# Patient Record
Sex: Female | Born: 1940 | Race: White | Hispanic: No | Smoking: Former smoker
Health system: Southern US, Community
[De-identification: ages and names within clinical notes are randomized; demographics above are authoritative.]

## PROBLEM LIST (undated history)

## (undated) DIAGNOSIS — E039 Hypothyroidism, unspecified: Secondary | ICD-10-CM

## (undated) DIAGNOSIS — I639 Cerebral infarction, unspecified: Secondary | ICD-10-CM

## (undated) DIAGNOSIS — F418 Other specified anxiety disorders: Secondary | ICD-10-CM

## (undated) DIAGNOSIS — I10 Essential (primary) hypertension: Secondary | ICD-10-CM

## (undated) DIAGNOSIS — I219 Acute myocardial infarction, unspecified: Secondary | ICD-10-CM

## (undated) HISTORY — DX: Other specified anxiety disorders: F41.8

## (undated) HISTORY — DX: Hypothyroidism, unspecified: E03.9

## (undated) HISTORY — PX: CORONARY ANGIOPLASTY WITH STENT PLACEMENT: SHX49

## (undated) HISTORY — PX: JOINT REPLACEMENT: SHX530

---

## 2012-06-01 DIAGNOSIS — I2510 Atherosclerotic heart disease of native coronary artery without angina pectoris: Secondary | ICD-10-CM

## 2012-06-01 DIAGNOSIS — I219 Acute myocardial infarction, unspecified: Secondary | ICD-10-CM

## 2012-06-01 HISTORY — PX: CORONARY ANGIOPLASTY WITH STENT PLACEMENT: SHX49

## 2012-06-01 HISTORY — DX: Atherosclerotic heart disease of native coronary artery without angina pectoris: I25.10

## 2012-06-01 HISTORY — DX: Acute myocardial infarction, unspecified: I21.9

## 2012-06-21 DIAGNOSIS — I2510 Atherosclerotic heart disease of native coronary artery without angina pectoris: Secondary | ICD-10-CM | POA: Insufficient documentation

## 2012-06-21 DIAGNOSIS — I252 Old myocardial infarction: Secondary | ICD-10-CM | POA: Insufficient documentation

## 2014-06-01 HISTORY — PX: GASTROSTOMY TUBE PLACEMENT: SHX655

## 2018-03-18 ENCOUNTER — Other Ambulatory Visit: Payer: Self-pay

## 2018-03-18 ENCOUNTER — Encounter (HOSPITAL_COMMUNITY): Payer: Self-pay | Admitting: Emergency Medicine

## 2018-03-18 ENCOUNTER — Emergency Department (HOSPITAL_COMMUNITY): Payer: Medicare Other

## 2018-03-18 ENCOUNTER — Emergency Department (HOSPITAL_COMMUNITY)
Admission: EM | Admit: 2018-03-18 | Discharge: 2018-03-19 | Disposition: A | Discharge status: Home / Self Care | Payer: Medicare Other | Attending: Emergency Medicine | Admitting: Emergency Medicine

## 2018-03-18 DIAGNOSIS — N83202 Unspecified ovarian cyst, left side: Secondary | ICD-10-CM | POA: Insufficient documentation

## 2018-03-18 DIAGNOSIS — R1032 Left lower quadrant pain: Secondary | ICD-10-CM | POA: Diagnosis present

## 2018-03-18 DIAGNOSIS — I10 Essential (primary) hypertension: Secondary | ICD-10-CM | POA: Insufficient documentation

## 2018-03-18 DIAGNOSIS — K6389 Other specified diseases of intestine: Secondary | ICD-10-CM

## 2018-03-18 DIAGNOSIS — Z955 Presence of coronary angioplasty implant and graft: Secondary | ICD-10-CM | POA: Insufficient documentation

## 2018-03-18 DIAGNOSIS — Z8673 Personal history of transient ischemic attack (TIA), and cerebral infarction without residual deficits: Secondary | ICD-10-CM | POA: Insufficient documentation

## 2018-03-18 DIAGNOSIS — K529 Noninfective gastroenteritis and colitis, unspecified: Secondary | ICD-10-CM | POA: Insufficient documentation

## 2018-03-18 DIAGNOSIS — Z87891 Personal history of nicotine dependence: Secondary | ICD-10-CM | POA: Diagnosis not present

## 2018-03-18 HISTORY — DX: Essential (primary) hypertension: I10

## 2018-03-18 HISTORY — DX: Acute myocardial infarction, unspecified: I21.9

## 2018-03-18 HISTORY — DX: Cerebral infarction, unspecified: I63.9

## 2018-03-18 LAB — CBC WITH DIFFERENTIAL/PLATELET
Abs Immature Granulocytes: 0.02 10*3/uL (ref 0.00–0.07)
Basophils Absolute: 0 10*3/uL (ref 0.0–0.1)
Basophils Relative: 1 %
Eosinophils Absolute: 0.1 10*3/uL (ref 0.0–0.5)
Eosinophils Relative: 2 %
HCT: 38.5 % (ref 36.0–46.0)
Hemoglobin: 11.5 g/dL — ABNORMAL LOW (ref 12.0–15.0)
Immature Granulocytes: 0 %
Lymphocytes Relative: 22 %
Lymphs Abs: 1.4 10*3/uL (ref 0.7–4.0)
MCH: 25.1 pg — ABNORMAL LOW (ref 26.0–34.0)
MCHC: 29.9 g/dL — ABNORMAL LOW (ref 30.0–36.0)
MCV: 83.9 fL (ref 80.0–100.0)
Monocytes Absolute: 0.6 10*3/uL (ref 0.1–1.0)
Monocytes Relative: 8 %
Neutro Abs: 4.4 10*3/uL (ref 1.7–7.7)
Neutrophils Relative %: 67 %
Platelets: 255 10*3/uL (ref 150–400)
RBC: 4.59 MIL/uL (ref 3.87–5.11)
RDW: 15 % (ref 11.5–15.5)
WBC: 6.5 10*3/uL (ref 4.0–10.5)
nRBC: 0 % (ref 0.0–0.2)

## 2018-03-18 LAB — COMPREHENSIVE METABOLIC PANEL
ALT: 10 U/L (ref 0–44)
AST: 14 U/L — ABNORMAL LOW (ref 15–41)
Albumin: 3.5 g/dL (ref 3.5–5.0)
Alkaline Phosphatase: 76 U/L (ref 38–126)
Anion gap: 7 (ref 5–15)
BUN: 8 mg/dL (ref 8–23)
CO2: 25 mmol/L (ref 22–32)
Calcium: 8.7 mg/dL — ABNORMAL LOW (ref 8.9–10.3)
Chloride: 106 mmol/L (ref 98–111)
Creatinine, Ser: 1.05 mg/dL — ABNORMAL HIGH (ref 0.44–1.00)
GFR calc Af Amer: 58 mL/min — ABNORMAL LOW (ref >60)
GFR calc non Af Amer: 50 mL/min — ABNORMAL LOW (ref >60)
Glucose, Bld: 93 mg/dL (ref 70–99)
Potassium: 3.4 mmol/L — ABNORMAL LOW (ref 3.5–5.1)
Sodium: 138 mmol/L (ref 135–145)
Total Bilirubin: 0.5 mg/dL (ref 0.3–1.2)
Total Protein: 6 g/dL — ABNORMAL LOW (ref 6.5–8.1)

## 2018-03-18 LAB — LIPASE, BLOOD: Lipase: 30 U/L (ref 11–51)

## 2018-03-18 MED ORDER — IOHEXOL 300 MG/ML  SOLN
100.0000 mL | Freq: Once | INTRAMUSCULAR | Status: AC | PRN
  Administered 2018-03-18: 100 mL via INTRAVENOUS

## 2018-03-18 NOTE — ED Triage Notes (Signed)
Pt reports LLQ abd pain that has been going on for the last month. Denies N/V/D. Last BM today, chronic constipation. Pt gets around with wheelchair. 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

## 2018-03-18 NOTE — ED Notes (Signed)
Iv removed, per patient's request.  Bubbling/bruising noted to area.  Bleeding controlled upon removal

## 2018-03-18 NOTE — ED Provider Notes (Signed)
Patient placed in Quick Look pathway, seen and evaluated   Chief Complaint: Abdominal pain  HPI:   LLQ abd pain x1 month, worse "lately". Denies n/v/d, fevers, urinary symptoms, last BM today, chronic constipation. "Tested positive for colon cancer" 4 years ago but had just experienced stroke at the time and never followed up.   ROS: +abd pain  Physical Exam:   Gen: No distress  Neuro: Awake and Alert  Skin: Warm    Focused Exam: Heart regular rate and rhythm, no murmurs rubs or gallops.  Lungs clear to auscultation.  Hyperactive bowel sounds.  Exquisite tenderness to palpation of the left lower quadrant.  No CVA tenderness.  Guarding noted.  No rebound.   Initiation of care has begun. The patient has been counseled on the process, plan, and necessity for staying for the completion/evaluation, and the remainder of the medical screening examination    Bennye Alm 03/18/18 1636    Dione Booze, MD 03/19/18 0030

## 2018-03-19 MED ORDER — HYDROCODONE-ACETAMINOPHEN 5-325 MG PO TABS: 1.0000 | ORAL_TABLET | Freq: Four times a day (QID) | ORAL | 0 refills | Status: DC | PRN

## 2018-03-19 NOTE — ED Notes (Signed)
Patient verbalizes understanding of discharge instructions. Opportunity for questioning and answers were provided. Armband removed by staff, pt discharged from ED in wheelchair with family.  

## 2018-03-19 NOTE — Discharge Instructions (Signed)
Continue taking home medication as prescribed.  Use tylenol as needed for mild to moderate pain. Use noroc as needed for severe pain. If you take norco, make sure you drink lots of water, take mirlax, and a stool softener to prevent constipation.  Follow up with your primary care doctor regarding the ovarian cyst and the findings of possible colon cancer.  Return to the ER if you develop high fevers, severe/persistent pain, persistent vomiting, or any new or concerning symptoms.

## 2018-03-19 NOTE — ED Provider Notes (Signed)
[facility] EMERGENCY DEPARTMENT Provider Note   CSN: [id number] Arrival date & time: 03/18/18  1608     History   Chief Complaint Chief Complaint  Patient presents with  . Abdominal Pain    HPI Kimberly Mora is a 77 y.o. female presenting for evaluation of abdominal pain.  Patient states that the past month, she has been feeling poorly.  In the past several days, she has had left mid to lower quadrant abdominal pain.  Pain is intermittent, worse with palpation.  She denies fevers, chills, chest pain, shortness breath, nausea, vomiting, urinary symptoms, abnormal bowel movements.  She has not taken anything for pain.  Pain does not change with oral intake, urination, or bowel movements.  Patient states she has a history of constipation, takes MiraLAX daily.   Additional history obtained from patient's daughter, who states that when patient was recently admitted for stroke, there were concerns that she might have colon cancer, but this has not been followed up on or evaluated since.  HPI  Past Medical History:  Diagnosis Date  . Hypertension   . MI (myocardial infarction) (HCC)   . Stroke [facility])    rt sided deficits, aphasia    There are no active problems to display for this patient.   Past Surgical History:  Procedure Laterality Date  . CORONARY ANGIOPLASTY WITH STENT PLACEMENT       OB History   None      Home Medications    Prior to Admission medications   Medication Sig Start Date End Date Taking? Authorizing Provider  HYDROcodone-acetaminophen (NORCO/VICODIN) 5-325 MG tablet Take 1 tablet by mouth every 6 (six) hours as needed for severe pain. 03/19/18   Rexton Greulich, PA-C    Family History No family history on file.  Social History Social History   Tobacco Use  . Smoking status: Former Smoker    Years: 30.00    Types: Cigarettes  . Smokeless tobacco: Never Used  Substance Use Topics  . Alcohol use: Never    Frequency:  Never  . Drug use: Never     Allergies   Patient has no known allergies.   Review of Systems Review of Systems  Gastrointestinal: Positive for abdominal pain.  All other systems reviewed and are negative.    Physical Exam Updated Vital Signs BP (!) 151/66 (BP Location: Left Arm)   Pulse (!) 56   Temp (!) 97.5 F (36.4 C) (Axillary)   Resp 18   Ht 5\' 2"  (1.575 m)   SpO2 99%   Physical Exam  Constitutional: She is oriented to person, place, and time. She appears well-developed and well-nourished. No distress.  Elderly female resting comfortably in the bed in no acute distress.  HENT:  Head: Normocephalic and atraumatic.  Right-sided facial droop, baseline per daughter.  Eyes: Pupils are equal, round, and reactive to light. Conjunctivae and EOM are normal.  Neck: Normal range of motion. Neck supple.  Cardiovascular: Normal rate, regular rhythm and intact distal pulses.  Pulmonary/Chest: Effort normal and breath sounds normal. No respiratory distress. She has no wheezes.  Abdominal: Soft. She exhibits no distension and no mass. There is tenderness. There is no rebound and no guarding.  Tenderness palpation of left mid and lower abdomen.  Negative rebound.  No signs of peritonitis.  No rigidity, guarding, distention.  Musculoskeletal: Normal range of motion.  Neurological: She is alert and oriented to person, place, and time.  Slurred speech, baseline per pt.  Skin: Skin is warm and dry. Capillary refill takes less than 2 seconds.  Psychiatric: She has a normal mood and affect.  Nursing note and vitals reviewed.    ED Treatments / Results  Labs (all labs ordered are listed, but only abnormal results are displayed) Labs Reviewed  CBC WITH DIFFERENTIAL/PLATELET - Abnormal; Notable for the following components:      Result Value   Hemoglobin 11.5 (*)    MCH 25.1 (*)    MCHC 29.9 (*)    All other components within normal limits  COMPREHENSIVE METABOLIC PANEL -  Abnormal; Notable for the following components:   Potassium 3.4 (*)    Creatinine, Ser 1.05 (*)    Calcium 8.7 (*)    Total Protein 6.0 (*)    AST 14 (*)    GFR calc non Af Amer 50 (*)    GFR calc Af Amer 58 (*)    All other components within normal limits  LIPASE, BLOOD  URINALYSIS, ROUTINE W REFLEX MICROSCOPIC    EKG None  Radiology Ct Abdomen Pelvis W Contrast  Result Date: 03/18/2018 CLINICAL DATA:  Left lower quadrant pain for 1 month. EXAM: CT ABDOMEN AND PELVIS WITH CONTRAST TECHNIQUE: Multidetector CT imaging of the abdomen and pelvis was performed using the standard protocol following bolus administration of intravenous contrast. CONTRAST:  100 mL OMNIPAQUE IOHEXOL 300 MG/ML  SOLN COMPARISON:  None. FINDINGS: Lower chest: There is some dependent atelectasis in the lung bases. No pleural or pericardial effusion. Cardiomegaly noted. Hepatobiliary: A few tiny stones are seen layering dependently within the gallbladder. No evidence of cholecystitis. The liver is low attenuating consistent with fatty infiltration. No focal liver lesion. Biliary tree appears normal. Pancreas: Unremarkable. No pancreatic ductal dilatation or surrounding inflammatory changes. Spleen: Normal in size without focal abnormality. Adrenals/Urinary Tract: The adrenal glands appear normal. Single small cysts are seen in the lower pole of each kidney. The kidneys are otherwise unremarkable. Ureters and urinary bladder appear normal. Stomach/Bowel: A few scattered sigmoid diverticula are noted but there is no evidence of diverticulitis. Inflammatory changes seen surrounding of fat structure along the descending colon consistent with inflammation of an epiploic appendage. No abscess. The colon is otherwise unremarkable. The stomach, small bowel and appendix appear normal. Vascular/Lymphatic: Aortic atherosclerosis. No enlarged abdominal or pelvic lymph nodes. Reproductive: Left ovarian cystic lesion measures 2.6 cm  transverse by 4 cm AP by 3.3 cm craniocaudal. The uterus and right adnexa appear normal. Other: None. Musculoskeletal: No acute or focal abnormality. 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 IMPRESSION: The examination is positive for appendagitis epiploica distal descending colon. Left ovarian cyst cannot be definitively characterized. Pelvic ultrasound is recommended for further evaluation. This recommendation follows ACR consensus guidelines: White Paper of the ACR Incidental Findings Committee II on Adnexal Findings. J Am Coll Radiol [phone]. Gallstones without evidence of cholecystitis. Mild diverticulosis without diverticulitis. Electronically Signed   By: Drusilla Kanner M.D.   On: 03/18/2018 19:45    Procedures Procedures (including critical care time)  Medications Ordered in ED Medications  iohexol (OMNIPAQUE) 300 MG/ML solution 100 mL (100 mLs Intravenous Contrast Given 03/18/18 1913)     Initial Impression / Assessment and Plan / ED Course  I have reviewed the triage vital signs and the nursing notes.  Pertinent labs & imaging results that were available during my care of the patient were reviewed by me and considered in my medical decision making (  see chart for details).     Patient presenting for evaluation of abdominal pain.  Physical examination, she is afebrile not tachycardic.  Appears nontoxic.  Labs reassuring, no leukocytosis.  Kidney, liver, pancreatic function reassuring.  Exam with mid to lower left lower quadrant abdominal pain.  Otherwise reassuring.  CT shows epiploic appendagitis and left ovarian cyst.  No obstruction, perforation, or obvious infection.  No signs of a surgical abdomen.  Discussed findings with patient and daughter.  Discussed treatment with pain control and time.  Additionally, patient to follow-up with PCP regarding ovarian cyst and possible need for work-up for colon  cancer.  Case discussed with attending, Dr. Preston Fleeting evaluated the patient.  Patient to trial Tylenol for mild to moderate pain.  Narcotics only for severe pain, as patient already has issues with constipation.  Encouraged follow-up with PCP.  At this time, patient appears safe for discharge.  Return precautions given.  Patient states she understands and agrees to plan.  Final Clinical Impressions(s) / ED Diagnoses   Final diagnoses:  Epiploic appendagitis  Left ovarian cyst    ED Discharge Orders         Ordered    HYDROcodone-acetaminophen (NORCO/VICODIN) 5-325 MG tablet  Every 6 hours PRN     03/19/18 0025           Alveria Apley, PA-C 03/19/18 0132    Dione Booze, MD 03/19/18 [phone]

## 2020-05-01 DIAGNOSIS — K648 Other hemorrhoids: Secondary | ICD-10-CM

## 2020-05-01 DIAGNOSIS — K8020 Calculus of gallbladder without cholecystitis without obstruction: Secondary | ICD-10-CM

## 2020-05-01 HISTORY — DX: Calculus of gallbladder without cholecystitis without obstruction: K80.20

## 2020-05-01 HISTORY — DX: Other hemorrhoids: K64.8

## 2020-05-17 ENCOUNTER — Encounter (HOSPITAL_COMMUNITY): Payer: Self-pay | Admitting: Pharmacy Technician

## 2020-05-17 ENCOUNTER — Other Ambulatory Visit: Payer: Self-pay

## 2020-05-17 ENCOUNTER — Emergency Department (HOSPITAL_COMMUNITY)
Admission: EM | Admit: 2020-05-17 | Discharge: 2020-05-18 | Disposition: A | Discharge status: Home / Self Care | Payer: Medicare Other | Attending: Emergency Medicine | Admitting: Emergency Medicine

## 2020-05-17 DIAGNOSIS — I10 Essential (primary) hypertension: Secondary | ICD-10-CM | POA: Diagnosis not present

## 2020-05-17 DIAGNOSIS — K648 Other hemorrhoids: Secondary | ICD-10-CM

## 2020-05-17 DIAGNOSIS — Z87891 Personal history of nicotine dependence: Secondary | ICD-10-CM | POA: Diagnosis not present

## 2020-05-17 DIAGNOSIS — K8020 Calculus of gallbladder without cholecystitis without obstruction: Secondary | ICD-10-CM

## 2020-05-17 DIAGNOSIS — K625 Hemorrhage of anus and rectum: Secondary | ICD-10-CM | POA: Diagnosis not present

## 2020-05-17 DIAGNOSIS — K649 Unspecified hemorrhoids: Secondary | ICD-10-CM

## 2020-05-17 DIAGNOSIS — R109 Unspecified abdominal pain: Secondary | ICD-10-CM | POA: Diagnosis present

## 2020-05-17 LAB — URINALYSIS, ROUTINE W REFLEX MICROSCOPIC
Bilirubin Urine: NEGATIVE
Glucose, UA: NEGATIVE mg/dL
Ketones, ur: NEGATIVE mg/dL
Nitrite: NEGATIVE
Protein, ur: NEGATIVE mg/dL
Specific Gravity, Urine: 1.003 — ABNORMAL LOW (ref 1.005–1.030)
pH: 7 (ref 5.0–8.0)

## 2020-05-17 LAB — CBC
HCT: 38.6 % (ref 36.0–46.0)
Hemoglobin: 12.3 g/dL (ref 12.0–15.0)
MCH: 26.9 pg (ref 26.0–34.0)
MCHC: 31.9 g/dL (ref 30.0–36.0)
MCV: 84.3 fL (ref 80.0–100.0)
Platelets: 255 10*3/uL (ref 150–400)
RBC: 4.58 MIL/uL (ref 3.87–5.11)
RDW: 15.8 % — ABNORMAL HIGH (ref 11.5–15.5)
WBC: 5.3 10*3/uL (ref 4.0–10.5)
nRBC: 0 % (ref 0.0–0.2)

## 2020-05-17 LAB — COMPREHENSIVE METABOLIC PANEL
ALT: 12 U/L (ref 0–44)
AST: 17 U/L (ref 15–41)
Albumin: 3.3 g/dL — ABNORMAL LOW (ref 3.5–5.0)
Alkaline Phosphatase: 65 U/L (ref 38–126)
Anion gap: 10 (ref 5–15)
BUN: 5 mg/dL — ABNORMAL LOW (ref 8–23)
CO2: 23 mmol/L (ref 22–32)
Calcium: 8.6 mg/dL — ABNORMAL LOW (ref 8.9–10.3)
Chloride: 108 mmol/L (ref 98–111)
Creatinine, Ser: 0.87 mg/dL (ref 0.44–1.00)
GFR, Estimated: >60 mL/min (ref >60)
Glucose, Bld: 87 mg/dL (ref 70–99)
Potassium: 3.5 mmol/L (ref 3.5–5.1)
Sodium: 141 mmol/L (ref 135–145)
Total Bilirubin: 0.7 mg/dL (ref 0.3–1.2)
Total Protein: 5.5 g/dL — ABNORMAL LOW (ref 6.5–8.1)

## 2020-05-17 LAB — TYPE AND SCREEN
ABO/RH(D): A POS
Antibody Screen: NEGATIVE

## 2020-05-17 LAB — LIPASE, BLOOD: Lipase: 29 U/L (ref 11–51)

## 2020-05-17 NOTE — ED Triage Notes (Signed)
Pt bib ems from home with RUQ abdominal pain onset this morning. Pt also endorses BRB in stool today. R sided deficit from previous CVA. No new neuro deficits per pt. Pt is on plavix. VSS. HR 64, 12 lead unremarkable, 148/82, 98% RA, RR 20, 98.66F, CBG 110.

## 2020-05-17 NOTE — ED Notes (Signed)
Pt reports "going to the bathroom and having bloody drip", also report feeling "fusy and faint"

## 2020-05-18 ENCOUNTER — Emergency Department (HOSPITAL_COMMUNITY): Payer: Medicare Other

## 2020-05-18 DIAGNOSIS — K625 Hemorrhage of anus and rectum: Secondary | ICD-10-CM | POA: Diagnosis not present

## 2020-05-18 LAB — POC OCCULT BLOOD, ED: Fecal Occult Bld: NEGATIVE

## 2020-05-18 LAB — ABO/RH: ABO/RH(D): A POS

## 2020-05-18 LAB — PROTIME-INR
INR: 0.9 (ref 0.8–1.2)
Prothrombin Time: 12 seconds (ref 11.4–15.2)

## 2020-05-18 LAB — HEMOGLOBIN AND HEMATOCRIT, BLOOD
HCT: 40.6 % (ref 36.0–46.0)
Hemoglobin: 12.5 g/dL (ref 12.0–15.0)

## 2020-05-18 MED ORDER — HYDROCODONE-ACETAMINOPHEN 5-325 MG PO TABS: 1.0000 | ORAL_TABLET | Freq: Four times a day (QID) | ORAL | 0 refills | Status: DC | PRN

## 2020-05-18 MED ORDER — ONDANSETRON 4 MG PO TBDP
4.0000 mg | ORAL_TABLET | Freq: Once | ORAL | Status: AC
  Administered 2020-05-18: 4 mg via ORAL
  Filled 2020-05-18: qty 1

## 2020-05-18 MED ORDER — ONDANSETRON 4 MG PO TBDP: 4.0000 mg | ORAL_TABLET | Freq: Four times a day (QID) | ORAL | 0 refills | Status: DC | PRN

## 2020-05-18 MED ORDER — IOHEXOL 300 MG/ML  SOLN
100.0000 mL | Freq: Once | INTRAMUSCULAR | Status: AC | PRN
  Administered 2020-05-18: 100 mL via INTRAVENOUS

## 2020-05-18 MED ORDER — DOCUSATE SODIUM 100 MG PO CAPS: 100.0000 mg | ORAL_CAPSULE | Freq: Two times a day (BID) | ORAL | 0 refills | Status: AC

## 2020-05-18 MED ORDER — HYDROCORTISONE (PERIANAL) 2.5 % EX CREA: 1.0000 "application " | TOPICAL_CREAM | Freq: Two times a day (BID) | CUTANEOUS | 0 refills | Status: DC

## 2020-05-18 MED ORDER — HYDROCODONE-ACETAMINOPHEN 5-325 MG PO TABS
1.0000 | ORAL_TABLET | Freq: Once | ORAL | Status: AC
  Administered 2020-05-18: 1 via ORAL
  Filled 2020-05-18: qty 1

## 2020-05-18 NOTE — ED Notes (Signed)
E-signature pad unavailable at time of pt discharge. This RN discussed discharge materials with pt and answered all pt questions. Pt stated understanding of discharge material. ? ?

## 2020-05-18 NOTE — ED Provider Notes (Addendum)
TIME SEEN: 12:58 AM  CHIEF COMPLAINT: Abdominal pain, rectal bleeding  HPI: Patient is an 79 year old female with history of hypertension, previous stroke with right-sided deficits and aphasia, CAD who presents to the emergency department with her daughter for concerns for rectal bleeding and abdominal pain for the past 1 to 2 days.  They noticed that she had a mass at her rectum that appeared abnormal.  She is having bright red blood per rectum.  No melena.  No clots.  No fevers, vomiting or diarrhea.  No chest pain or shortness of breath.  She is on Plavix.  No previous history of abdominal surgeries.  Denies dysuria, hematuria, vaginal bleeding or discharge.  ROS: See HPI Constitutional: no fever  Eyes: no drainage  ENT: no runny nose   Cardiovascular:  no chest pain  Resp: no SOB  GI: no vomiting GU: no dysuria Integumentary: no rash  Allergy: no hives  Musculoskeletal: no leg swelling  Neurological: no slurred speech ROS otherwise negative  PAST MEDICAL HISTORY/PAST SURGICAL HISTORY:  Past Medical History:  Diagnosis Date   Hypertension    MI (myocardial infarction) (HCC)    Stroke (HCC)    rt sided deficits, aphasia    MEDICATIONS:  Prior to Admission medications   Medication Sig Start Date End Date Taking? Authorizing Provider  HYDROcodone-acetaminophen (NORCO/VICODIN) 5-325 MG tablet Take 1 tablet by mouth every 6 (six) hours as needed for severe pain. 03/19/18   Caccavale, Sophia, PA-C    ALLERGIES:  No Known Allergies  SOCIAL HISTORY:  Social History   Tobacco Use   Smoking status: Former Smoker    Years: 30.00    Types: Cigarettes   Smokeless tobacco: Never Used  Substance Use Topics   Alcohol use: Never    FAMILY HISTORY: No family history on file.  EXAM: BP (!) 167/73 (BP Location: Left Arm)    Pulse 78    Temp 97.8 F (36.6 C) (Oral)    Resp 18    Ht 5\' 1"  (1.549 m)    SpO2 99%  CONSTITUTIONAL: Alert and oriented and responds appropriately  to questions.  Elderly, in no distress HEAD: Normocephalic EYES: Conjunctivae clear, pupils appear equal, EOM appear intact ENT: normal nose; moist mucous membranes NECK: Supple, normal ROM CARD: RRR; S1 and S2 appreciated; no murmurs, no clicks, no rubs, no gallops RESP: Normal chest excursion without splinting or tachypnea; breath sounds clear and equal bilaterally; no wheezes, no rhonchi, no rales, no hypoxia or respiratory distress, speaking full sentences ABD/GI: Normal bowel sounds; non-distended; soft, tender in the right upper and mid abdomen but more so in the right lower abdomen with voluntary guarding, no rebound, negative Murphy sign RECTAL:  Normal rectal tone, no gross blood or melena, guaiac negative, patient has nonthrombosed external hemorrhoids on exam, patient has two prolapsed internal hemorrhoids that are not actively bleeding, no rectal prolapse, nontender rectal exam, no fecal impaction. BACK:  The back appears normal EXT: Normal ROM in all joints; no deformity noted, no edema; no cyanosis SKIN: Normal color for age and race; warm; no rash on exposed skin NEURO: Weaker on the right side compared to the left which is chronic, mild aphasia PSYCH: The patient's mood and manner are appropriate.   MEDICAL DECISION MAKING: Patient here with abdominal pain and rectal bleeding.  I suspect her rectal bleeding is due to hemorrhoids.  She has two prolapsed internal hemorrhoids on exam.  No rectal prolapse.  Unable to reduce at bedside.  Hemoglobin obtained  initially is 12.3.  Given she has waited an extended period of time, will recheck hemoglobin to ensure no decline.  Will obtain CT of the abdomen pelvis given her right lower abdominal pain.  Differential includes appendicitis, colitis, diverticulitis, bowel obstruction, cholecystitis, cholelithiasis.  ED PROGRESS: Repeat hemoglobin is stable.  No coagulopathy or thrombocytopenia.  Urine appears contaminated.  She is not having urinary  symptoms at this time.  CT scan shows cholelithiasis.  She also has what appears to be a left adnexal cysts that is incidental in nature.  She is not having any left-sided abdominal pain.  Recommended outpatient ultrasound.  Suspect cholelithiasis as a cause of her symptoms.  Recommended general surgery follow-up for her cholelithiasis as well as her hemorrhoids.  She is not actively bleeding at this time and is guaiac negative.  Will discharge with pain and nausea medicine.  Patient and daughter are comfortable with this plan.  Recommended low-fat diet.  At this time, I do not feel there is any life-threatening condition present. I have reviewed, interpreted and discussed all results (EKG, imaging, lab, urine as appropriate) and exam findings with patient/family. I have reviewed nursing notes and appropriate previous records.  I feel the patient is safe to be discharged home without further emergent workup and can continue workup as an outpatient as needed. Discussed usual and customary return precautions. Patient/family verbalize understanding and are comfortable with this plan.  Outpatient follow-up has been provided as needed. All questions have been answered.    Kimberly Mora was evaluated in Emergency Department on 05/18/2020 for the symptoms described in the history of present illness. She was evaluated in the context of the global COVID-19 pandemic, which necessitated consideration that the patient might be at risk for infection with the SARS-CoV-2 virus that causes COVID-19. Institutional protocols and algorithms that pertain to the evaluation of patients at risk for COVID-19 are in a state of rapid change based on information released by regulatory bodies including the CDC and federal and state organizations. These policies and algorithms were followed during the patient's care in the ED.      Kimberly Mora, Layla Maw, DO 05/18/20 0444    Cherine Drumgoole, Layla Maw, DO 05/18/20 [phone]

## 2020-05-18 NOTE — ED Notes (Signed)
Orthostatic vital signs have not been completed because patient is unable to stand because of prior stroke. Pt has right sided paralysis.

## 2020-05-18 NOTE — Discharge Instructions (Signed)
You are found to have gallstones in your gallbladder that are likely causing your right-sided abdominal pain.  I recommend a low-fat diet and follow-up with general surgery.  You were also found to have external and internal prolapsed hemorrhoids.  These are likely the cause of your rectal bleeding.  Your blood counts today were stable and normal.  Please follow-up with general surgery.  We we are prescribing you with Anusol to help with pain, swelling and bleeding.  I recommend keeping your stool soft with over-the-counter stool softener such as Colace and a high-fiber diet.  If you cannot increase your fiber intake in your diet, you may use Metamucil or Benefiber over-the-counter.  Please note that the narcotic pain medication that we are prescribing you for your abdominal pain can make you more constipated and constipation can worsen pain and bleeding from hemorrhoids so it is very important to keep your stool soft.  I recommend only taking this pain medication if absolutely necessary.  You may also take over-the-counter Tylenol 1000 mg every 6 hours as needed for pain.  Your CT scan also incidentally showed a cyst on your left ovary.  The radiologist recommends an ultrasound that can be done as an outpatient.  This does not need to be done emergently unless you did start to develop left-sided lower abdominal pain, vaginal bleeding.

## 2020-06-06 ENCOUNTER — Other Ambulatory Visit: Payer: Self-pay | Admitting: Surgery

## 2020-06-06 DIAGNOSIS — R1011 Right upper quadrant pain: Secondary | ICD-10-CM

## 2020-06-13 ENCOUNTER — Ambulatory Visit
Admission: RE | Admit: 2020-06-13 | Discharge: 2020-06-13 | Disposition: A | Discharge status: Home / Self Care | Payer: Medicare Other | Source: Ambulatory Visit | Attending: Surgery | Admitting: Surgery

## 2020-06-13 DIAGNOSIS — R1011 Right upper quadrant pain: Secondary | ICD-10-CM

## 2020-06-21 ENCOUNTER — Ambulatory Visit (INDEPENDENT_AMBULATORY_CARE_PROVIDER_SITE_OTHER): Payer: Medicare Other | Admitting: Cardiology

## 2020-06-21 ENCOUNTER — Encounter: Payer: Self-pay | Admitting: Cardiology

## 2020-06-21 ENCOUNTER — Other Ambulatory Visit: Payer: Self-pay

## 2020-06-21 VITALS — BP 128/60 | HR 69 | Ht 61.0 in | Wt 132.6 lb

## 2020-06-21 DIAGNOSIS — Z9861 Coronary angioplasty status: Secondary | ICD-10-CM | POA: Diagnosis not present

## 2020-06-21 DIAGNOSIS — I2510 Atherosclerotic heart disease of native coronary artery without angina pectoris: Secondary | ICD-10-CM | POA: Diagnosis not present

## 2020-06-21 DIAGNOSIS — K8010 Calculus of gallbladder with chronic cholecystitis without obstruction: Secondary | ICD-10-CM | POA: Insufficient documentation

## 2020-06-21 DIAGNOSIS — I252 Old myocardial infarction: Secondary | ICD-10-CM | POA: Diagnosis not present

## 2020-06-21 DIAGNOSIS — E785 Hyperlipidemia, unspecified: Secondary | ICD-10-CM

## 2020-06-21 DIAGNOSIS — K8020 Calculus of gallbladder without cholecystitis without obstruction: Secondary | ICD-10-CM

## 2020-06-21 DIAGNOSIS — Z8673 Personal history of transient ischemic attack (TIA), and cerebral infarction without residual deficits: Secondary | ICD-10-CM

## 2020-06-21 DIAGNOSIS — I10 Essential (primary) hypertension: Secondary | ICD-10-CM

## 2020-06-21 DIAGNOSIS — K8309 Other cholangitis: Secondary | ICD-10-CM

## 2020-06-21 DIAGNOSIS — Z01810 Encounter for preprocedural cardiovascular examination: Secondary | ICD-10-CM | POA: Diagnosis not present

## 2020-06-21 NOTE — Progress Notes (Signed)
Primary Care Provider: Patient, No Pcp Per Cardiologist: Glenetta Hew, MD Electrophysiologist: None  Clinic Note: Chief Complaint  Patient presents with  . Pre-op Exam  . 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  At this point, she has not had any angina since her MI.  She is on clopidogrel for combination of MI and CVA.  She is on verapamil-unusual dosing of 120 mg TID (presumably in place of beta-blocker), along with rosuvastatin 40 mg.  She is part about that holding Plavix is reasonable 5 days preop for surgeries.  With no recurrent angina, although she is not reactive, there would be no need to proceed with ischemic evaluation prior to an upcoming surgery.  This was answered to delay a necessary operation..      Relevant Medications   rosuvastatin (CRESTOR) 40 MG tablet   verapamil (CALAN) 120 MG tablet   Other Relevant Orders   EKG 12-Lead   ECHOCARDIOGRAM COMPLETE   History of heart attack-unknown details (Chronic)    I would like to get the actual cath report and hospitalization report.  I cannot tell that she has had any type of evaluation of her heart since 2016 when she left [place].  Apparently the daughter normally does have some records we will try to get those records.  Based on the fact that she has had an MRI and a stroke, but it is reasonable to check an echocardiogram to get a baseline assessment of her EF and valvular structures.  She does have a soft murmur..  We will check a 2D echocardiogram prior to her surgery.  Provided this is normal, there should be no reason for any further cardiac evaluation preoperatively.      Relevant Orders   EKG 12-Lead   ECHOCARDIOGRAM COMPLETE   Essential hypertension (Chronic)    Blood pressure stable.  She is on  verapamil, and I will continue for now      Relevant Medications   rosuvastatin (CRESTOR) 40 MG tablet   verapamil (CALAN) 120 MG tablet   Hyperlipidemia with target LDL less than 70 (Chronic)    She just had lipids checked and they are all relatively well controlled.  LDL is 67.  Plan: Continue rosuvastatin.      Relevant Medications   rosuvastatin (CRESTOR) 40 MG tablet   verapamil (CALAN) 120 MG tablet   Preop cardiovascular exam - Primary (Chronic)    Unfortunately [name] is relatively immobile and therefore not able to determine if she is having any active anginal symptoms with exertion, however with whenever she is able to do, she is not having any symptoms and therefore would not clinically warranted ischemic evaluation.  I do think getting a 2D echocardiogram is reasonable to assess EF, wall motion, and valvular structures to get a baseline prior to her surgery.  She is far enough out from both her stroke and MI/PCI to hold Plavix 5 days preop for surgeries or procedures.  She can restart when safe postoperatively.  --> Recommendation will be to proceed to the OR provider Echocardiogram is relatively normal.  We will review the results of the Echocardiogram (hopefully before next week) for conclusive recommendations.  Otherwise would proceed to the OR without further evaluation. We will go ahead and have her hold Plavix as  of now.  She will  PREOPERATIVE CARDIAC RISK ASSESSMENT   Revised Cardiac Risk Index:  High Risk Surgery: no; provided the procedure is done laparoscopically-would be intermediate risk.  (If done open, would be high risk)  Defined as Intraperitoneal, intrathoracic or suprainguinal vascular  Active CAD: yes; but no active symptoms  CHF: no;   Cerebrovascular Disease: yes; presumably-has report of stroke (although this could have been TBI)  Diabetes: no; On Insulin: no  CKD (Cr >~ 2): no; most recent creatinine was 0.88  Total: 2 Estimated Risk of  Adverse Outcome: Class II-III Estimated Risk of MI, PE, VF/VT (Cardiac Arrest), Complete Heart Block: 6-10.1 % --> this is based on presence of CAD, albeit not active.   ACC/AHA Guidelines for "Clearance":  Step 1 - Need for Emergency Surgery: No: But urgent  If Yes - go straight to OR with perioperative surveillance  Step 2 - Active Cardiac Conditions (Unstable Angina, Decompensated HF, Significant  Arrhytmias - Complete HB, Mobitz II, Symptomatic VT or SVT, Severe Aortic Stenosis - mean gradient > 40 mmHg, Valve area < 1.0 cm2):   No: No active angina.  PCI was in 2014.  If Yes - Evaluate & Treat per ACC/AHA Guidelines  Step 3 -  Low Risk Surgery: No: Intermediate risk  If Yes --> proceed to OR  If No --> Step 4  Step 4 - Functional Capacity >= 4 METS without symptoms: No: Wheelchair-bound  If Yes --> proceed to OR  If No --> Step 5  Step 5 --  Clinical Risk Factors (CRF)   3 or more: No:   If Yes -- assess Surgical Risk, --   (High Risk Non-cardiac), Intraabdominal or thoracic vascular surgery consider testing if it will change management.  Intermediate Risk: Proceed to OR with HR control, or consider testing if it will change management  1-2 or more CRFs: Yes  If Yes -- assess Surgical Risk, --   (High Risk Non-cardiac), Intraabdominal or thoracic vascular surgery --> Proceed to OR, or consider testing if it will change management.  Intermediate Risk: Proceed to OR with HR control, or consider testing if it will change management       Relevant Orders   EKG 12-Lead   ECHOCARDIOGRAM COMPLETE   Cholelithiasis with acute cholangitis    She is having significant pain and needs to have her surgery.  I would not delay her surgery for ischemic evaluation unless her echocardiogram is grossly abnormal.      History of stroke     ===================================  HPI:    Kimberly Mora is a 80 y.o. female with a PMH Notable for prior CVA, HLD and listed  diagnosis of coronary artery disease (unclear details) who is being seen today today for STAT CARDIAC CLEARANCE for [facility] SURGERY at the request of Armandina Gemma, MD.  Kimberly Mora was last seen on June 05, 2020 by Dr. Harlow Asa as part of her ER follow-up from December 17 -> deferred by the EDP.  She presented with right upper quadrant pain and rectal bleeding.  Was found to have gallstones and active cholelithiasis without cholecystitis.  Upon evaluation with Dr. Darcus Austin she was noting persistent episodes of right upper quadrant pain that has been difficult to control as far as pain goes.  There is also complaint of rectal bleeding which is thought to be related to internal hemorrhoids.  Recent Hospitalizations:   December 17-18th, 2021: ER visit for abdominal pain x2 days.  Noted to have  ER/PR.  Was noticed to have a "mass at the rectum that was abnormal.  CT scan suggested cholelithiasis and left adnexal cyst.  Cholecystectomy and hemorrhoidectomy was recommended.  Reviewed  CV studies:    The following studies were reviewed today: (if available, images/films reviewed: From Epic Chart or Care Everywhere) . No reports available cardiac cath-PCI.  Also no report of echo.:  Interval History:   Kimberly Mora presents here today with her daughter-for preop evaluation for cholecystectomy and internal hemorrhoid surgery.  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  She is essentially wheelchair-bound, but can walk some-mostly limited by unsteady gait.  She can use her walker to get around the house a little bit, but mostly sits in a wheelchair.  She tries to do some exertional activities but is not very active.  She apparently suffered an MI in 1497 requiring helicopter flight to [place] from her hometown in [place] (close to [place]).  She does not have any recollection of this  episode nor does the daughter was here with her.  I do not have any records available.  Thankfully, she has not had any adverse cardiac events since then.  Apparently the last time she saw her cardiologist was in 2016 prior to her stroke episode that led to her moving to [place].  She does not have any complaints of any chest pain or pressure with rest or exertion.  She has not had any heart failure symptoms of PND, orthopnea or edema.  No syncope or near syncope.  No rapid or irregular heartbeats, palpitations.  The patient does not have symptoms concerning for COVID-19 infection (fever, chills, cough, or new shortness of breath).   REVIEWED OF SYSTEMS   Review of Systems  Constitutional: Negative for fever, malaise/fatigue and weight loss.  HENT: Negative for congestion and nosebleeds.   Respiratory: Negative for cough and shortness of breath.   Cardiovascular: Negative for leg swelling.  Gastrointestinal: Positive for abdominal pain (Right upper quadrant), blood in stool, constipation and nausea. Negative for melena.  Genitourinary: Negative for hematuria.  Neurological: Positive for focal weakness. Negative for dizziness, speech change (No change, has chronic slurred speech with expressive aphasia.), seizures, loss of consciousness and headaches.       Right upper extremity dense paralysis, able to move right leg, but unable to walk stably  Psychiatric/Behavioral: Negative for depression and memory loss. The patient is not nervous/anxious and does not have insomnia.     I have reviewed and (if needed) personally updated the patient's problem list, medications, allergies, past medical and surgical history, social and family history.   PAST MEDICAL HISTORY   Past Medical History:  Diagnosis Date  . Anxiety with depression    On Lexapro  . Bleeding internal hemorrhoids 05/2020  . CAD S/P percutaneous coronary angioplasty 2014   In setting of an MI  . Cholelithiasis without  cholecystitis 05/2020  . Hypertension   . Hypothyroidism (acquired)    On Synthroid  . MI (myocardial infarction) [facility]) 2014   Details are not clear: She was transferred from the local hospital (presumably via helicopter) to [place], [place] & did have Cardiac Cath - PCI. (report not available).  . Stroke [facility])    rt sided deficits, aphasia => Per patient and daughter, the details of this event are not completely sure.  She was found covered with blood and had a skull fracture.  There  is question of potential valve collapse.    PAST SURGICAL HISTORY   Past Surgical History:  Procedure Laterality Date  . CORONARY ANGIOPLASTY WITH STENT PLACEMENT  2014   In setting of AMI - unclear details; Report not available.  Marland Kitchen GASTROSTOMY TUBE PLACEMENT  2016   Following the stroke; removed upon ability to take p.o.    There is no immunization history on file for this patient.  MEDICATIONS/ALLERGIES   Current Meds  Medication Sig  . Calcium Carbonate-Vitamin D3 600-400 MG-UNIT TABS 1 tablet  . cetirizine (ZYRTEC) 10 MG tablet Take 10 mg by mouth daily.  . clopidogrel (PLAVIX) 75 MG tablet Take 75 mg by mouth daily.  Marland Kitchen docusate sodium (COLACE) 100 MG capsule Take 1 capsule (100 mg total) by mouth every 12 (twelve) hours.  Marland Kitchen escitalopram (LEXAPRO) 20 MG tablet Take 20 mg by mouth daily.  Marland Kitchen HYDROcodone-acetaminophen (NORCO/VICODIN) 5-325 MG tablet Take 1 tablet by mouth every 6 (six) hours as needed.  . hydrocortisone (ANUSOL-HC) 2.5 % rectal cream Place 1 application rectally 2 (two) times daily.  Marland Kitchen levothyroxine (SYNTHROID) 75 MCG tablet Take 75 mcg by mouth. Monday Wenesday and Friday  . rosuvastatin (CRESTOR) 40 MG tablet Take 40 mg by mouth daily.  Marland Kitchen SYNTHROID 50 MCG tablet Take 50 mcg by mouth every morning. Tuesday, Thursday, Saturday and Sunday  . verapamil (CALAN) 120 MG tablet Take 1 tablet by mouth 3 (three) times daily.  Marland Kitchen zolpidem (AMBIEN) 5 MG tablet Take 1 tablet by mouth at  bedtime.    No Known Allergies  SOCIAL HISTORY/FAMILY HISTORY   Reviewed in Epic:  Pertinent findings:  Social History   Tobacco Use  . Smoking status: Former Smoker    Years: 30.00    Types: Cigarettes    Quit date: 2014    Years since quitting: 8.0  . Smokeless tobacco: Never Used  Vaping Use  . Vaping Use: Never used  Substance Use Topics  . Alcohol use: Never    Comment: Although there is report of occasional use.  . Drug use: Never   Social History   Social History Narrative   ALLEYA DEMETER is a divorced mother of 3 daughters.   She is originally from [place], [place], but moved to [place] (to live near one of her daughters) in 2016 after suffering what was presumably a stroke => the details of this event were relatively unclear.  As a result of this episode, she got divorced, and moved away from her ex-husband.  There was a question of whether or not he potentially could have caused the episode.  Unfortunately the data hospital was not available.  Family was not sure.   --> Just recently, she moved to the [place] area to be closer to another daughter that lives here locally (the third lives in [place]-- > they decided that [place] was a good central location).      Up until recently she has been followed by Drs. Making housecalls.  She does live independently, and is able to do most ADLs by herself.  She is mostly wheelchair-bound because she has dense hemiparesis of the right side and also has some slurred speech and mild expressive aphasia.    OBJCTIVE -PE, EKG, labs   Wt Readings from Last 3 Encounters:  06/21/20 132 lb 9.6 oz (60.1 kg)    Physical Exam: BP 128/60 (BP Location: Left Arm, Patient Position: Sitting)   Pulse 69   Ht _0  (1.549 m)  Wt 132 lb 9.6 oz (60.1 kg)   SpO2 92%   BMI 25.05 kg/m  Physical Exam Vitals reviewed.  Constitutional:      General: She is not in acute distress.    Appearance: She is normal weight. She is not  ill-appearing or toxic-appearing.     Comments: Wheelchair-bound with dense right hemiplegia/paralysis.  Relatively healthy appearing.  HENT:     Head: Normocephalic and atraumatic.  Neck:     Vascular: No carotid bruit, hepatojugular reflux or JVD.  Cardiovascular:     Rate and Rhythm: Normal rate and regular rhythm. Occasional extrasystoles are present.    Chest Wall: PMI is not displaced.     Pulses: Intact distal pulses.     Heart sounds: Heart sounds are distant. Murmur (2/6 SEM at RUSB.) heard.  No friction rub. No gallop.   Pulmonary:     Effort: Pulmonary effort is normal. No respiratory distress.     Breath sounds: Normal breath sounds.  Chest:     Chest wall: No tenderness.  Abdominal:     General: Bowel sounds are normal. There is no distension.     Palpations: Abdomen is soft. There is no mass.  Musculoskeletal:        General: No swelling.     Cervical back: Normal range of motion and neck supple.     Comments: She has contracture of the right upper extremity and mild atrophy of the upper and lower extremity in the right side.  Neurological:     Mental Status: She is alert and oriented to person, place, and time.     Comments: Mild right facial droop.  Dense right hemiparalysis. Expressive aphasia with slurred speech  Psychiatric:        Mood and Affect: Mood normal.        Behavior: Behavior normal.        Thought Content: Thought content normal.        Judgment: Judgment normal.     Comments: She gets frustrated when she tries to answer questions, starts to speak is if she knows what she was saying, and then gives up because she has a hard time with aphasia.     Adult ECG Report  Rate: 69 ;  Rhythm: normal sinus rhythm and (Difficult to interpret due to underlying tremor).  Cannot rule out anterolateral MI, age-indeterminate or inferior MI, age-indeterminate.;   Narrative Interpretation: Abnormal EKG, no baseline available.  Recent Labs: Reviewed from care  [place])  Name Result Date Reference Range  Lipid Panel with Reflex to LDL, Direct  2020-06-18   Cholesterol, Total 138  <200  HDL Cholesterol 45.3  40.0 - 60.0  LDL Cholesterol Calculated 67  0 - 99  Triglycerides 130.0  0-150  VLDL Cholesterol Calculated 26  5 - 40  TSH  2020-06-18   TSH 8.88  0.55-4.78  CBC without Differential / Platelets  2020-02-27   Hematocrit 41.5  35.5 - 44.9  Hemoglobin 11.4  11.6 - 15.0  MCH 27.0  26.6 - 33.0  MCHC 27.5  31.5 - 35.7  MCV 98.1  78.2 - 97.9  Platelets 243  157.000 - 371.000  RBC 4.23  3.92 - 5.13  RDW 16.4  12.3 - 15.4  WBC 5.18  3.40 - 9.60  Complete Metabolic Panel (CMP)  7867-54-49   A/G Ratio 2.6  1.2 - 2.2  Albumin 3.8  3.2 - 4.8  Alkaline Phosphatase 76  46 - 116  ALT (  SGPT) 9  10 - 49  AST (SGOT) 11.5  < 34.0  Bilirubin, Total 0.3  0.3 - 1.2  Blood Urea Nitrogen (BUN) 10  9 - 23  BUN/Creatinine Ratio 10.91  9.00 - 20.00  Calcium (Ca) 8.7  8.3 - 10.6  Carbon Dioxide (ECO2) 28  20 - 31  Chloride (Cl) 109  98 - 107  Creatinine 0.88  .55-1.3  eGFR 66.00  > 59  eGFR African American 80.00  > 59  Globulin, Total 1.5  1.5 - 4.5  Glucose 97  74-106  Potassium (K) 3.9  3.4-5.1  Protein, Total 5.3  6.0 - 8.0  Sodium (Na) 145  137-145  TSH  2020-01-08   TSH 0.18  0.47 - 4.68  CBC with Differential / Platelets  2019-10-09   Baso (Absolute) 0.07  0.01 - 0.20  Basos 1.20  2.00  Eos 1.6  1.0 - 7.0  Eos (Absolute) 0.09  0.40  Hematocrit 42.6  35.5 - 44.9  Hemoglobin 12.8  11.6 - 15.0  Platelets 215  157.000 - 371.000   Lipid Panel (LP)  2019-06-26   Cholesterol, Total 125  100 - 199  HDL Cholesterol 52.0  See Note  LDL Cholesterol Calculated 46  0 - 99  Triglycerides 134  0 - 149    No results found for: CHOL, HDL, LDLCALC, LDLDIRECT, TRIG, CHOLHDL Lab Results  Component Value Date   CREATININE 0.87 05/17/2020   BUN 5 (L) 05/17/2020    NA 141 05/17/2020   K 3.5 05/17/2020   CL 108 05/17/2020   CO2 23 05/17/2020   CBC Latest Ref Rng & Units 05/18/2020 05/17/2020 03/18/2018  WBC 4.0 - 10.5 K/uL - 5.3 6.5  Hemoglobin 12.0 - 15.0 g/dL 12.5 12.3 11.5(L)  Hematocrit 36.0 - 46.0 % 40.6 38.6 38.5  Platelets 150 - 400 K/uL - 255 255    No results found for: TSH  ==================================================  COVID-19 Education: The signs and symptoms of COVID-19 were discussed with the patient and how to seek care for testing (follow up with PCP or arrange E-visit).   The importance of social distancing and COVID-19 vaccination was discussed today. The patient is practicing social distancing & Masking.   I spent a total of 31mnutes with the patient spent in direct patient consultation.  Additional time spent with chart review  / charting (studies, outside notes, etc): 18 min Total Time: 76 min  Current medicines are reviewed at length with the patient today.  (+/- concerns) n/a  This visit occurred during the SARS-CoV-2 public health emergency.  Safety protocols were in place, including screening questions prior to the visit, additional usage of staff PPE, and extensive cleaning of exam room while observing appropriate contact time as indicated for disinfecting solutions.  Notice: This dictation was prepared with Dragon dictation along with smaller phrase technology. Any transcriptional errors that result from this process are unintentional and may not be corrected upon review.  Patient Instructions / Medication Changes & Studies & Tests Ordered   Patient Instructions  Medication Instructions:   go had an hold Clopidogrel now in preparation for surgery.  *If you need a refill on your cardiac medications before your next appointment, please call your pharmacy*   Lab Work: Not needed at present  If you have labs (blood work) drawn today and your tests are completely normal, you will receive your results  only by: .Marland KitchenMyChart Message (if you have MyChart) OR . A paper copy in the mail  If you have any lab test that is abnormal or we need to change your treatment, we will call you to review the results.   Testing/Procedures: Will be schedule at [facility] has requested that you have an echocardiogram. Echocardiography is a painless test that uses sound waves to create images of your heart. It provides your doctor with information about the size and shape of your heart and how well your heart's chambers and valves are working. This procedure takes approximately one hour. There are no restrictions for this procedure.  (if echo is normal you have clearance for surgery )   Follow-Up: At [facility], you and your health needs are our priority.  As part of our continuing mission to provide you with exceptional heart care, we have created designated Provider Care Teams.  These Care Teams include your primary Cardiologist (physician) and Advanced Practice Providers (APPs -  Physician Assistants and Nurse Practitioners) who all work together to provide you with the care you need, when you need it.  We recommend signing up for the patient portal called "MyChart".  Sign up information is provided on this After Visit Summary.  MyChart is used to connect with patients for Virtual Visits (Telemedicine).  Patients are able to view lab/test results, encounter notes, upcoming appointments, etc.  Non-urgent messages can be sent to your provider as well.   To learn more about what you can do with MyChart, go to [email or web address].    Your next appointment:   6 to 8 week(s)  The format for your next appointment:   In Person  Provider:   Glenetta Hew, MD   Other Instructions    You are  Low/intermediate risk  For surgery .you will needed to hold Clopidogrel ( plavix) 5 days prior to surgery with Dr Harlow Asa and restart once cleared by surgeron    Studies Ordered:    Orders Placed This Encounter  Procedures  . EKG 12-Lead  . ECHOCARDIOGRAM COMPLETE     Glenetta Hew, M.D., M.S. Interventional Cardiologist   Pager # [phone] Phone # [phone] [address] [place], [place] [address]   Thank you for choosing [facility] at [facility]!!

## 2020-06-21 NOTE — Patient Instructions (Addendum)
Medication Instructions:   go had an hold Clopidogrel now in preparation for surgery.  *If you need a refill on your cardiac medications before your next appointment, please call your pharmacy*   Lab Work: Not needed at present  If you have labs (blood work) drawn today and your tests are completely normal, you will receive your results only by: Marland Kitchen MyChart Message (if you have MyChart) OR . A paper copy in the mail If you have any lab test that is abnormal or we need to change your treatment, we will call you to review the results.   Testing/Procedures: Will be schedule at [address] Your physician has requested that you have an echocardiogram. Echocardiography is a painless test that uses sound waves to create images of your heart. It provides your doctor with information about the size and shape of your heart and how well your heart's chambers and valves are working. This procedure takes approximately one hour. There are no restrictions for this procedure.  (if echo is normal you have clearance for surgery )   Follow-Up: At [facility], you and your health needs are our priority.  As part of our continuing mission to provide you with exceptional heart care, we have created designated Provider Care Teams.  These Care Teams include your primary Cardiologist (physician) and Advanced Practice Providers (APPs -  Physician Assistants and Nurse Practitioners) who all work together to provide you with the care you need, when you need it.  We recommend signing up for the patient portal called "MyChart".  Sign up information is provided on this After Visit Summary.  MyChart is used to connect with patients for Virtual Visits (Telemedicine).  Patients are able to view lab/test results, encounter notes, upcoming appointments, etc.  Non-urgent messages can be sent to your provider as well.   To learn more about what you can do with MyChart, go to [email or web address].     Your next appointment:   6 to 8 week(s)  The format for your next appointment:   In Person  Provider:   Bryan Lemma, MD   Other Instructions    You are  Low/intermediate risk  For surgery .you will needed to hold Clopidogrel ( plavix) 5 days prior to surgery with Dr Gerrit Friends and restart once cleared by surgeron

## 2020-06-23 ENCOUNTER — Encounter: Payer: Self-pay | Admitting: Cardiology

## 2020-06-23 NOTE — Assessment & Plan Note (Signed)
She just had lipids checked and they are all relatively well controlled.  LDL is 67.  Plan: Continue rosuvastatin.

## 2020-06-23 NOTE — Assessment & Plan Note (Signed)
Blood pressure stable.  She is on verapamil, and I will continue for now

## 2020-06-23 NOTE — Assessment & Plan Note (Signed)
She is having significant pain and needs to have her surgery.  I would not delay her surgery for ischemic evaluation unless her echocardiogram is grossly abnormal.

## 2020-06-23 NOTE — Assessment & Plan Note (Signed)
Details unknown.  At this point, she has not had any angina since her MI.  She is on clopidogrel for combination of MI and CVA.  She is on verapamil-unusual dosing of 120 mg TID (presumably in place of beta-blocker), along with rosuvastatin 40 mg.  She is part about that holding Plavix is reasonable 5 days preop for surgeries.  With no recurrent angina, although she is not reactive, there would be no need to proceed with ischemic evaluation prior to an upcoming surgery.  This was answered to delay a necessary operation.Marland Kitchen

## 2020-06-23 NOTE — Assessment & Plan Note (Signed)
I would like to get the actual cath report and hospitalization report.  I cannot tell that she has had any type of evaluation of her heart since 2016 when she left [place].  Apparently the daughter normally does have some records we will try to get those records.  Based on the fact that she has had an MRI and a stroke, but it is reasonable to check an echocardiogram to get a baseline assessment of her EF and valvular structures.  She does have a soft murmur..  We will check a 2D echocardiogram prior to her surgery.  Provided this is normal, there should be no reason for any further cardiac evaluation preoperatively.

## 2020-06-23 NOTE — Assessment & Plan Note (Signed)
Unfortunately Kimberly Mora is relatively immobile and therefore not able to determine if she is having any active anginal symptoms with exertion, however with whenever she is able to do, she is not having any symptoms and therefore would not clinically warranted ischemic evaluation.  I do think getting a 2D echocardiogram is reasonable to assess EF, wall motion, and valvular structures to get a baseline prior to her surgery.  She is far enough out from both her stroke and MI/PCI to hold Plavix 5 days preop for surgeries or procedures.  She can restart when safe postoperatively.  --> Recommendation will be to proceed to the OR provider Echocardiogram is relatively normal.  We will review the results of the Echocardiogram (hopefully before next week) for conclusive recommendations.  Otherwise would proceed to the OR without further evaluation. We will go ahead and have her hold Plavix as of now.  She will  PREOPERATIVE CARDIAC RISK ASSESSMENT   Revised Cardiac Risk Index:  High Risk Surgery: no; provided the procedure is done laparoscopically-would be intermediate risk.  (If done open, would be high risk)  Defined as Intraperitoneal, intrathoracic or suprainguinal vascular  Active CAD: yes; but no active symptoms  CHF: no;   Cerebrovascular Disease: yes; presumably-has report of stroke (although this could have been TBI)  Diabetes: no; On Insulin: no  CKD (Cr >~ 2): no; most recent creatinine was 0.88  Total: 2 Estimated Risk of Adverse Outcome: Class II-III Estimated Risk of MI, PE, VF/VT (Cardiac Arrest), Complete Heart Block: 6-10.1 % --> this is based on presence of CAD, albeit not active.   ACC/AHA Guidelines for "Clearance":  Step 1 - Need for Emergency Surgery: No: But urgent  If Yes - go straight to OR with perioperative surveillance  Step 2 - Active Cardiac Conditions (Unstable Angina, Decompensated HF, Significant  Arrhytmias - Complete HB, Mobitz II, Symptomatic VT or SVT,  Severe Aortic Stenosis - mean gradient > 40 mmHg, Valve area < 1.0 cm2):   No: No active angina.  PCI was in 2014.  If Yes - Evaluate & Treat per ACC/AHA Guidelines  Step 3 -  Low Risk Surgery: No: Intermediate risk  If Yes --> proceed to OR  If No --> Step 4  Step 4 - Functional Capacity >= 4 METS without symptoms: No: Wheelchair-bound  If Yes --> proceed to OR  If No --> Step 5  Step 5 --  Clinical Risk Factors (CRF)   3 or more: No:   If Yes -- assess Surgical Risk, --   (High Risk Non-cardiac), Intraabdominal or thoracic vascular surgery consider testing if it will change management.  Intermediate Risk: Proceed to OR with HR control, or consider testing if it will change management  1-2 or more CRFs: Yes  If Yes -- assess Surgical Risk, --   (High Risk Non-cardiac), Intraabdominal or thoracic vascular surgery --> Proceed to OR, or consider testing if it will change management.  Intermediate Risk: Proceed to OR with HR control, or consider testing if it will change management

## 2020-06-25 ENCOUNTER — Ambulatory Visit (HOSPITAL_COMMUNITY): Payer: Medicare Other | Attending: Internal Medicine

## 2020-06-25 ENCOUNTER — Other Ambulatory Visit: Payer: Self-pay

## 2020-06-25 ENCOUNTER — Encounter: Payer: Self-pay | Admitting: Cardiology

## 2020-06-25 ENCOUNTER — Ambulatory Visit (INDEPENDENT_AMBULATORY_CARE_PROVIDER_SITE_OTHER): Payer: Medicare Other | Admitting: Cardiology

## 2020-06-25 VITALS — Ht 61.0 in | Wt 132.0 lb

## 2020-06-25 DIAGNOSIS — I712 Thoracic aortic aneurysm, without rupture, unspecified: Secondary | ICD-10-CM

## 2020-06-25 DIAGNOSIS — Z9861 Coronary angioplasty status: Secondary | ICD-10-CM | POA: Diagnosis not present

## 2020-06-25 DIAGNOSIS — I2510 Atherosclerotic heart disease of native coronary artery without angina pectoris: Secondary | ICD-10-CM

## 2020-06-25 DIAGNOSIS — I252 Old myocardial infarction: Secondary | ICD-10-CM

## 2020-06-25 DIAGNOSIS — Z01810 Encounter for preprocedural cardiovascular examination: Secondary | ICD-10-CM | POA: Diagnosis not present

## 2020-06-25 DIAGNOSIS — K8020 Calculus of gallbladder without cholecystitis without obstruction: Secondary | ICD-10-CM

## 2020-06-25 DIAGNOSIS — Z8673 Personal history of transient ischemic attack (TIA), and cerebral infarction without residual deficits: Secondary | ICD-10-CM

## 2020-06-25 DIAGNOSIS — I7122 Aneurysm of the aortic arch, without rupture: Secondary | ICD-10-CM

## 2020-06-25 DIAGNOSIS — K8309 Other cholangitis: Secondary | ICD-10-CM

## 2020-06-25 LAB — BASIC METABOLIC PANEL
BUN/Creatinine Ratio: 9 — ABNORMAL LOW (ref 12–28)
BUN: 7 mg/dL — ABNORMAL LOW (ref 8–27)
CO2: 21 mmol/L (ref 20–29)
Calcium: 8.7 mg/dL (ref 8.7–10.3)
Chloride: 101 mmol/L (ref 96–106)
Creatinine, Ser: 0.76 mg/dL (ref 0.57–1.00)
GFR calc Af Amer: 86 mL/min/{1.73_m2} (ref >59)
GFR calc non Af Amer: 74 mL/min/{1.73_m2} (ref >59)
Glucose: 83 mg/dL (ref 65–99)
Potassium: 3.5 mmol/L (ref 3.5–5.2)
Sodium: 138 mmol/L (ref 134–144)

## 2020-06-25 LAB — ECHOCARDIOGRAM COMPLETE
Area-P 1/2: 1.68 cm2
P 1/2 time: 533 msec
S' Lateral: 2.5 cm

## 2020-06-25 MED ORDER — METOPROLOL TARTRATE 25 MG PO TABS: 25.0000 mg | ORAL_TABLET | Freq: Every day | ORAL | 3 refills | Status: DC

## 2020-06-25 MED ORDER — LOSARTAN POTASSIUM 25 MG PO TABS: 25.0000 mg | ORAL_TABLET | Freq: Every day | ORAL | 3 refills | Status: DC

## 2020-06-25 NOTE — Progress Notes (Signed)
Cardiology Office Note:    Date:  06/25/2020   ID:  Kimberly Mora, DOB 1941-03-04, MRN [id number]  PCP:  Patient, No Pcp Per  [facility] Cardiologist:  Bryan Lemma, MD  [facility] Electrophysiologist:  None   Referring MD: No ref. provider found    History of Present Illness:    Kimberly Mora is a 80 y.o. female with a hx of coronary artery disease s/p PCI in 2016 in [place], prior CVA with residual right sided weakness, HTN, and HLD who presented for TTE today as part of pre-operative evaluation prior to cholecystectomy found to have an ascending aorta measuring 6.0cm for which I was called to evaluate.  Patient is followed by Dr. Herbie Baltimore with last visit 06/21/20. She was referred for pre-operative evaluation for symptomatic gallstones. Specifically, she has been having terrible, persistent right upper quadrant pain. At that visit, she denied any chest pain with rest or exertion. No heart failure symptoms. No syncope or near syncope or palpitations. Given history of MI, she was recommended for TTE prior to going to OR.   Today, I was called to the echo labs as patient noted to have ascending aorta measuring 6.0cm, arch 4.0cm, root not dilated at 3.4cm. Moderate AI. EF normal 60-65%. Blood pressure notably elevated at 180s. HR 70s. Patient denied any chest discomfort, lightheadedness, back pain. She was overall stable, however, given the severe dilation of her ascending, we recommended starting BB, losartan, obtaining a CTA and referral to CV surgery for further work-up.   Past Medical History:  Diagnosis Date  . Anxiety with depression    On Lexapro  . Bleeding internal hemorrhoids 05/2020  . CAD S/P percutaneous coronary angioplasty 2014   In setting of an MI  . Cholelithiasis without cholecystitis 05/2020  . Hypertension   . Hypothyroidism (acquired)    On Synthroid  . MI (myocardial infarction) [facility]) 2014   Details are not clear: She was transferred from the  local hospital (presumably via helicopter) to [place], [place] & did have Cardiac Cath - PCI. (report not available).  . Stroke [facility])    rt sided deficits, aphasia => Per patient and daughter, the details of this event are not completely sure.  She was found covered with blood and had a skull fracture.  There is question of potential valve collapse.    Past Surgical History:  Procedure Laterality Date  . CORONARY ANGIOPLASTY WITH STENT PLACEMENT  2014   In setting of AMI - unclear details; Report not available.  Marland Kitchen GASTROSTOMY TUBE PLACEMENT  2016   Following the stroke; removed upon ability to take p.o.    Current Medications: Current Meds  Medication Sig  . Calcium Carbonate-Vitamin D3 600-400 MG-UNIT TABS 1 tablet  . cetirizine (ZYRTEC) 10 MG tablet Take 10 mg by mouth daily.  . clopidogrel (PLAVIX) 75 MG tablet Take 75 mg by mouth daily.  Marland Kitchen docusate sodium (COLACE) 100 MG capsule Take 1 capsule (100 mg total) by mouth every 12 (twelve) hours.  Marland Kitchen escitalopram (LEXAPRO) 20 MG tablet Take 20 mg by mouth daily.  Marland Kitchen HYDROcodone-acetaminophen (NORCO/VICODIN) 5-325 MG tablet Take 1 tablet by mouth every 6 (six) hours as needed.  . hydrocortisone (ANUSOL-HC) 2.5 % rectal cream Place 1 application rectally 2 (two) times daily.  Marland Kitchen levothyroxine (SYNTHROID) 75 MCG tablet Take 75 mcg by mouth. Monday Wenesday and Friday  . losartan (COZAAR) 25 MG tablet Take 1 tablet (25 mg total) by mouth daily.  . metoprolol tartrate (  LOPRESSOR) 25 MG tablet Take 1 tablet (25 mg total) by mouth daily.  . ondansetron (ZOFRAN ODT) 4 MG disintegrating tablet Take 1 tablet (4 mg total) by mouth every 6 (six) hours as needed.  . rosuvastatin (CRESTOR) 40 MG tablet Take 40 mg by mouth daily.  Marland Kitchen. SYNTHROID 50 MCG tablet Take 50 mcg by mouth every morning. Tuesday, Thursday, Saturday and Sunday  . zolpidem (AMBIEN) 5 MG tablet Take 1 tablet by mouth at bedtime.  . [DISCONTINUED] verapamil (CALAN) 120 MG tablet Take 1  tablet by mouth 3 (three) times daily.     Allergies:   Patient has no known allergies.   Social History   Socioeconomic History  . Marital status: Divorced    Spouse name: Not on file  . Number of children: 3  . Years of education: Not on file  . Highest education level: Not on file  Occupational History  . Not on file  Tobacco Use  . Smoking status: Former Smoker    Years: 30.00    Types: Cigarettes    Quit date: 2014    Years since quitting: 8.0  . Smokeless tobacco: Never Used  Vaping Use  . Vaping Use: Never used  Substance and Sexual Activity  . Alcohol use: Never    Comment: Although there is report of occasional use.  . Drug use: Never  . Sexual activity: Not Currently  Other Topics Concern  . Not on file  Social History Narrative   Loma NewtonCarolyn F Uresti is a divorced mother of 3 daughters.   She is originally from [place], [place], but moved to [place] (to live near one of her daughters) in 2016 after suffering what was presumably a stroke => the details of this event were relatively unclear.  As a result of this episode, she got divorced, and moved away from her ex-husband.  There was a question of whether or not he potentially could have caused the episode.  Unfortunately the data hospital was not available.  Family was not sure.   --> Just recently, she moved to the [place] area to be closer to another daughter that lives here locally (the third lives in [place]-- > they decided that [place] was a good central location).      Up until recently she has been followed by Drs. Making housecalls.  She does live independently, and is able to do most ADLs by herself.  She is mostly wheelchair-bound because she has dense hemiparesis of the right side and also has some slurred speech and mild expressive aphasia.   Social Determinants of Health   Financial Resource Strain: Not on file  Food Insecurity: Not on file  Transportation Needs: Not on file  Physical Activity: Not  on file  Stress: Not on file  Social Connections: Not on file     Family History: The patient's family history includes Heart disease in her father; Hypertension in her father and mother; Thyroid disease in her mother and sister.  ROS:   Please see the history of present illness.    Review of Systems  Constitutional: Negative for chills and fever.  HENT: Negative for congestion.   Eyes: Negative for blurred vision.  Respiratory: Negative for shortness of breath.   Cardiovascular: Negative for chest pain, palpitations, orthopnea, claudication, leg swelling and PND.  Gastrointestinal: Positive for abdominal pain. Negative for blood in stool.  Genitourinary: Negative for hematuria.  Musculoskeletal: Positive for myalgias.  Neurological: Negative for dizziness and loss of consciousness.  Endo/Heme/Allergies:  Negative for polydipsia.  Psychiatric/Behavioral: The patient is nervous/anxious.     EKGs/Labs/Other Studies Reviewed:    The following studies were reviewed today: TTE preliminarily reviewed by me: LVEF 60-65%, moderate AI, ascending aorta 6.0cm, aortic root 3.4cm, aortic arch 4.0cm. No dissection flap visualized.   EKG:  NSR, possible old anterolateral an inferior MI  Recent Labs: 05/17/2020: ALT 12; BUN 5; Creatinine, Ser 0.87; Platelets 255; Potassium 3.5; Sodium 141 05/18/2020: Hemoglobin 12.5  Recent Lipid Panel No results found for: CHOL, TRIG, HDL, CHOLHDL, VLDL, LDLCALC, LDLDIRECT   Physical Exam:    VS:  Ht 5\' 1"  (1.549 m)   Wt 132 lb (59.9 kg)   BMI 24.94 kg/m     Wt Readings from Last 3 Encounters:  06/25/20 132 lb (59.9 kg)  06/21/20 132 lb 9.6 oz (60.1 kg)     GEN:  Wheelchair bound, comfortable HEENT: Normal NECK: No JVD; No carotid bruits CARDIAC: RRR, 2/6 systolic murmur RESPIRATORY:  Clear to auscultation without rales, wheezing or rhonchi  ABDOMEN: Soft, non-tender, non-distended MUSCULOSKELETAL:  Right upper extremity contracture.  SKIN:  Warm and dry NEUROLOGIC:  Dense right sided hemiparesis PSYCHIATRIC:  Normal affect   ASSESSMENT:    1. Thoracic aortic aneurysm without rupture (HCC)   2. Aortic arch aneurysm (HCC)   3. Coronary artery disease involving native coronary artery of native heart without angina pectoris   4. CAD S/P PCI in setting of MI   5. Cholelithiasis with acute cholangitis   6. History of stroke    PLAN:    In order of problems listed above:  #Thoracic aortic aneurysm: #Moderate AI: #Dilated aortic arch: Measuring 6cm on TTE today. No evidence of dissection. Moderate AI but preserved LV function. Blood pressure not well controlled. -Check CTA of aorta -Stop verapamil and start losartan 25mg  daily and metoprolol 25mg  XL daily -Refer to thoracic surgery -Discussed the importance of blood pressure control with goal SBP<120/80s; patient understands and will buy a blood pressure cuff today -Would defer non-emergent gallbladder surgery until evaluated by CV surgery -Discussed with Dr. 06/23/20 who will arrange for follow-up for the patient  #History of CAD s/p PCI: Occurred in 2016. No anginal symptoms. -Follow-up with Dr. as scheduled  #HTN: Poorly controlled today: -Change verapamil to metop and losartan as above -Patient will buy blood pressure cuff and let Herbie Baltimore know if blood pressure >120s/80s  #CVA: Dense right sided hemiparesis -Continue statin -Holding plavix   #Cholelithiasis without acute cholangitis: -Would hold on OR for this until evaluated by CV surgery   Plan discussed with the patient and her daughter at length.  Medication Adjustments/Labs and Tests Ordered: Current medicines are reviewed at length with the patient today.  Concerns regarding medicines are outlined above.  Orders Placed This Encounter  Procedures  . CT ANGIO CHEST AORTA W/CM & OR WO/CM  . Basic metabolic panel  . Ambulatory referral to Cardiothoracic Surgery   Meds ordered this encounter   Medications  . losartan (COZAAR) 25 MG tablet    Sig: Take 1 tablet (25 mg total) by mouth daily.    Dispense:  90 tablet    Refill:  3  . metoprolol tartrate (LOPRESSOR) 25 MG tablet    Sig: Take 1 tablet (25 mg total) by mouth daily.    Dispense:  90 tablet    Refill:  3    Patient Instructions  Medication Instructions:  Your physician has recommended you make the following change in your medication:  1.  START Losartan 25 mg taking 1 daily 2.  START Metoprolol 25 mg taking 1 daily 3.  STOP Verapamil  *If you need a refill on your cardiac medications before your next appointment, please call your pharmacy*   Lab Work: TODAY:  BMET  If you have labs (blood work) drawn today and your tests are completely normal, you will receive your results only by: Marland Kitchen MyChart Message (if you have MyChart) OR . A paper copy in the mail If you have any lab test that is abnormal or we need to change your treatment, we will call you to review the results.   Testing/Procedures: Non-Cardiac CT Angiography (CTA), ASAP is a special type of CT scan that uses a computer to produce multi-dimensional views of major blood vessels throughout the body. In CT angiography, a contrast material is injected through an IV to help visualize the blood vessels    Follow-Up: At [facility], you and your health needs are our priority.  As part of our continuing mission to provide you with exceptional heart care, we have created designated Provider Care Teams.  These Care Teams include your primary Cardiologist (physician) and Advanced Practice Providers (APPs -  Physician Assistants and Nurse Practitioners) who all work together to provide you with the care you need, when you need it.  We recommend signing up for the patient portal called "MyChart".  Sign up information is provided on this After Visit Summary.  MyChart is used to connect with patients for Virtual Visits (Telemedicine).  Patients are able to view  lab/test results, encounter notes, upcoming appointments, etc.  Non-urgent messages can be sent to your provider as well.   To learn more about what you can do with MyChart, go to [email or web address].    Your next appointment:   We are working on you an appointment this week with Dr. Herbie Baltimore. They will call you.  Provider:   Bryan Lemma, MD   Other Instructions      Signed, Meriam Sprague, MD  06/25/2020 11:58 AM    [facility]

## 2020-06-25 NOTE — Patient Instructions (Signed)
Medication Instructions:  Your physician has recommended you make the following change in your medication:  1.  START Losartan 25 mg taking 1 daily 2.  START Metoprolol 25 mg taking 1 daily 3.  STOP Verapamil  *If you need a refill on your cardiac medications before your next appointment, please call your pharmacy*   Lab Work: TODAY:  BMET  If you have labs (blood work) drawn today and your tests are completely normal, you will receive your results only by: Marland Kitchen MyChart Message (if you have MyChart) OR . A paper copy in the mail If you have any lab test that is abnormal or we need to change your treatment, we will call you to review the results.   Testing/Procedures: Non-Cardiac CT Angiography (CTA), ASAP is a special type of CT scan that uses a computer to produce multi-dimensional views of major blood vessels throughout the body. In CT angiography, a contrast material is injected through an IV to help visualize the blood vessels    Follow-Up: At [facility], you and your health needs are our priority.  As part of our continuing mission to provide you with exceptional heart care, we have created designated Provider Care Teams.  These Care Teams include your primary Cardiologist (physician) and Advanced Practice Providers (APPs -  Physician Assistants and Nurse Practitioners) who all work together to provide you with the care you need, when you need it.  We recommend signing up for the patient portal called "MyChart".  Sign up information is provided on this After Visit Summary.  MyChart is used to connect with patients for Virtual Visits (Telemedicine).  Patients are able to view lab/test results, encounter notes, upcoming appointments, etc.  Non-urgent messages can be sent to your provider as well.   To learn more about what you can do with MyChart, go to [email or web address].    Your next appointment:   We are working on you an appointment this week with Dr. Herbie Baltimore. They will  call you.  Provider:   Bryan Lemma, MD   Other Instructions

## 2020-06-26 ENCOUNTER — Telehealth: Payer: Self-pay | Admitting: *Deleted

## 2020-06-26 ENCOUNTER — Encounter (HOSPITAL_COMMUNITY): Payer: Self-pay | Admitting: Radiology

## 2020-06-26 ENCOUNTER — Ambulatory Visit (HOSPITAL_COMMUNITY)
Admission: RE | Admit: 2020-06-26 | Discharge: 2020-06-26 | Disposition: A | Discharge status: Home / Self Care | Payer: Medicare Other | Source: Ambulatory Visit | Attending: Cardiology | Admitting: Cardiology

## 2020-06-26 DIAGNOSIS — I712 Thoracic aortic aneurysm, without rupture, unspecified: Secondary | ICD-10-CM

## 2020-06-26 MED ORDER — IOHEXOL 350 MG/ML SOLN
100.0000 mL | Freq: Once | INTRAVENOUS | Status: AC | PRN
  Administered 2020-06-26: 100 mL via INTRAVENOUS

## 2020-06-26 NOTE — Telephone Encounter (Signed)
Per Dr Herbie Baltimore , reviewed  Echo  Because of the result from echo , Dr Herbie Baltimore would like patient to be evaluated by Thoracic Surgeon   Prior to having up coming surgery with Dr  Darnell Level. Patient does not need an appointment with Dr Herbie Baltimore, follow instruction given by Dr Shari Prows on 06/25/20 as of now until  Discussion is made by both surgeons. Per Dr Herbie Baltimore , he sent a message to Dr Gerrit Friends about new issue ( aorta) with patient Kimberly Mora.    RN left message for daughter to call back. _ ( to inform daughter that Dr Herbie Baltimore was aware of the result of the echo from yesterday and schedule CT of Aorta and  Appointment with Dr Laneta Simmers.)

## 2020-06-26 NOTE — Telephone Encounter (Signed)
The patient has been notified of the result and verbalized understanding.  All questions (if any) were answered.  Patients daughter states she began taking the Losartan yesterday, they are agreeable to monitoring the patients BP daily and getting back to [place] with an update in a few days to determine if an increase in the Losartan dose is needed.  Leanord Hawking, RN 06/26/2020 3:55 PM

## 2020-06-26 NOTE — Telephone Encounter (Signed)
-----   Message from Meriam Sprague, MD sent at 06/26/2020  3:47 PM EST ----- CT shows that her aorta is actually smaller than what we saw on echocardiogram measuring 4.8cm. There is no evidence of tear in the aorta. She should keep her appointment with thoracic surgery for them to weigh in further. She needs to control her blood pressure with goal <120s/80s. If she is not there with the metoprolol and losartan by the end of the week, can we increase the losartan to 50mg  daily? Thank you!

## 2020-06-28 NOTE — Progress Notes (Signed)
Good news about the thoracic aorta not being as dilated as anticipated by the echocardiogram.  I suspect that the cardiac surgeons are not likely to plan any urgent procedures. 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    We can start this (would opt for Eliquis 5 mg twice daily), but it can be held for her to have her GI surgery pending CVTS evaluation.   Bryan Lemma, MD

## 2020-07-01 MED ORDER — APIXABAN 5 MG PO TABS: 5.0000 mg | ORAL_TABLET | Freq: Two times a day (BID) | ORAL | 3 refills | Status: AC

## 2020-07-01 NOTE — Telephone Encounter (Signed)
-----   Message from Marykay Lex, MD sent at 06/28/2020 12:40 AM EST ----- Good news about the thoracic aorta not being as dilated as anticipated by the echocardiogram.  I suspect that the cardiac surgeons are not likely to plan any urgent procedures. It does not appear that Shyenne may have had chronic pulmonary emboli and should be on long-term anticoagulation.    We can start this (would opt for Eliquis 5 mg twice daily), but it can be held for her to have her GI surgery pending CVTS evaluation.   Bryan Lemma, MD

## 2020-07-01 NOTE — Telephone Encounter (Signed)
Spoke to patient  Daughter. She was calling in because Dr Shari Prows had requested blood pressure reading.  RN also informed Verlon Au  results of Ct of Aorta per Dr Herbie Baltimore.  she verbalized understanding.  She is aware Patient will need to start Eliquis 5 mg twice a day Stop taking  Clopidogrel 75 mg. Verlon Au verbalized understanding.

## 2020-07-01 NOTE — Telephone Encounter (Signed)
Patient's daughter returning call. She states the patient's BP is 119/79

## 2020-07-02 ENCOUNTER — Encounter: Payer: Medicare Other | Admitting: Surgery

## 2020-07-03 ENCOUNTER — Telehealth: Payer: Self-pay | Admitting: Cardiology

## 2020-07-03 ENCOUNTER — Other Ambulatory Visit: Payer: Self-pay

## 2020-07-03 ENCOUNTER — Encounter: Payer: Self-pay | Admitting: Thoracic Surgery (Cardiothoracic Vascular Surgery)

## 2020-07-03 ENCOUNTER — Institutional Professional Consult (permissible substitution) (INDEPENDENT_AMBULATORY_CARE_PROVIDER_SITE_OTHER): Payer: Medicare Other | Admitting: Thoracic Surgery (Cardiothoracic Vascular Surgery)

## 2020-07-03 VITALS — BP 189/74 | HR 70 | Resp 20 | Ht 61.0 in | Wt 134.0 lb

## 2020-07-03 DIAGNOSIS — I712 Thoracic aortic aneurysm, without rupture, unspecified: Secondary | ICD-10-CM

## 2020-07-03 DIAGNOSIS — I2510 Atherosclerotic heart disease of native coronary artery without angina pectoris: Secondary | ICD-10-CM

## 2020-07-03 DIAGNOSIS — Z9861 Coronary angioplasty status: Secondary | ICD-10-CM

## 2020-07-03 MED ORDER — VERAPAMIL HCL 120 MG PO TABS: 120.0000 mg | ORAL_TABLET | Freq: Three times a day (TID) | ORAL | 6 refills | Status: DC

## 2020-07-03 NOTE — Telephone Encounter (Signed)
RN spoke to patient's daughter.  Per Dr Herbie Baltimore,  Patient is to stop Metoprolol 25 mg  Daily and restart Verapamil 120 mg three times a day .  Continue with Losartan 25 mg daily.   Keep blood pressure Log.   Daughter Verlon Au)  states understanding of information . Verlon Au states she threw away the bottle of Verapamil and will need a new proscription sent to pharmacy . RN e-scribe new prescription to [employer] [facility].

## 2020-07-03 NOTE — Telephone Encounter (Signed)
Pt c/o BP issue: STAT if pt c/o blurred vision, one-sided weakness or slurred speech  1. What are your last 5 BP readings?  189/74 HR 70 190/90 HR ?  2. Are you having any other symptoms (ex. Dizziness, headache, blurred vision, passed out)? No.  3. What is your BP issue? Patients daughter is calling stating that her BP is higher than normal. She would like to be called on her cell [phone]. Please advise.

## 2020-07-03 NOTE — Progress Notes (Signed)
PCP is Patient, No Pcp Per Referring Provider is Meriam Sprague, MD  Chief Complaint  Patient presents with  . Thoracic Aortic Aneurysm    Initial surgical consult, CTA 06/26/20    HPI: Mrs. Kimberly Mora is sent for consultation regarding an ascending thoracic aortic aneurysm.  Kimberly Mora is an 80 year old woman with a past medical history significant for CAD, MI, coronary stent, stroke with residual right hemiparesis, hypertension, hypothyroidism, cholelithiasis, anxiety and depression, and chronic pulmonary emboli.    Kimberly Mora was being evaluated for cholecystectomy.  Kimberly Mora was sent to Dr. Herbie Baltimore for cardiac clearance.  Kimberly Mora had her MI and stent placement in [place] and those records are not available.  Kimberly Mora also had her stroke while living there.  Kimberly Mora saw Dr. Herbie Baltimore for Kimberly Mora had an echocardiogram which showed moderate AI.  There was preserved left ventricular systolic function.  There was a large ascending aneurysm.  Kimberly Mora then had a CT chest which was reported to show a 4.8 cm ascending aneurysm.  There also was evidence of chronic pulmonary emboli.  Her Plavix was discontinued and Kimberly Mora was started on Eliquis.  Kimberly Mora been having right upper quadrant pain.  For a long time this was constant.  Tends to be more associated with eating.  Kimberly Mora has been evaluated by Dr. Gerrit Friends and is to be scheduled to have a cholecystectomy.  Kimberly Mora said her blood pressure medications changed recently.  Kimberly Mora lives at home.  Kimberly Mora does have meals delivered.  Kimberly Mora is sedentary most of the time and has difficulty walking due to her prior stroke.  Kimberly Mora is able to walk a little with a walker.   Past Medical History:  Diagnosis Date  . Anxiety with depression    On Lexapro  . Bleeding internal hemorrhoids 05/2020  . CAD S/P percutaneous coronary angioplasty 2014   In setting of an MI  . Cholelithiasis without cholecystitis 05/2020  . Hypertension   . Hypothyroidism (acquired)    On Synthroid  . MI (myocardial infarction)  [facility]) 2014   Details are not clear: Kimberly Mora was transferred from the local hospital (presumably via helicopter) to [place], [place] & did have Cardiac Cath - PCI. (report not available).  . Stroke [facility])    rt sided deficits, aphasia => Per patient and daughter, the details of this event are not completely sure.  Kimberly Mora was found covered with blood and had a skull fracture.  There is question of potential valve collapse.    Past Surgical History:  Procedure Laterality Date  . CORONARY ANGIOPLASTY WITH STENT PLACEMENT  2014   In setting of AMI - unclear details; Report not available.  Marland Kitchen GASTROSTOMY TUBE PLACEMENT  2016   Following the stroke; removed upon ability to take p.o.    Family History  Problem Relation Age of Onset  . Hypertension Mother   . Thyroid disease Mother   . Hypertension Father   . Heart disease Father        Unknown  . Thyroid disease Sister     Social History Social History   Tobacco Use  . Smoking status: Former Smoker    Years: 30.00    Types: Cigarettes    Quit date: 2014    Years since quitting: 8.0  . Smokeless tobacco: Never Used  Vaping Use  . Vaping Use: Never used  Substance Use Topics  . Alcohol use: Never    Comment: Although there is report of occasional use.  . Drug use: Never  Current Outpatient Medications  Medication Sig Dispense Refill  . apixaban (ELIQUIS) 5 MG TABS tablet Take 1 tablet (5 mg total) by mouth 2 (two) times daily. 180 tablet 3  . Calcium Carbonate-Vitamin D3 600-400 MG-UNIT TABS 1 tablet    . cetirizine (ZYRTEC) 10 MG tablet Take 10 mg by mouth daily.    Marland Kitchen docusate sodium (COLACE) 100 MG capsule Take 1 capsule (100 mg total) by mouth every 12 (twelve) hours. 60 capsule 0  . escitalopram (LEXAPRO) 20 MG tablet Take 20 mg by mouth daily.    Marland Kitchen HYDROcodone-acetaminophen (NORCO/VICODIN) 5-325 MG tablet Take 1 tablet by mouth every 6 (six) hours as needed. 10 tablet 0  . hydrocortisone (ANUSOL-HC) 2.5 % rectal cream Place 1  application rectally 2 (two) times daily. 30 g 0  . levothyroxine (SYNTHROID) 75 MCG tablet Take 75 mcg by mouth. Monday Wenesday and Friday    . losartan (COZAAR) 25 MG tablet Take 1 tablet (25 mg total) by mouth daily. 90 tablet 3  . metoprolol tartrate (LOPRESSOR) 25 MG tablet Take 1 tablet (25 mg total) by mouth daily. 90 tablet 3  . rosuvastatin (CRESTOR) 40 MG tablet Take 40 mg by mouth daily.    Marland Kitchen SYNTHROID 50 MCG tablet Take 50 mcg by mouth every morning. Tuesday, Thursday, Saturday and Sunday    . zolpidem (AMBIEN) 5 MG tablet Take 1 tablet by mouth at bedtime.    . ondansetron (ZOFRAN ODT) 4 MG disintegrating tablet Take 1 tablet (4 mg total) by mouth every 6 (six) hours as needed. (Patient not taking: Reported on 07/03/2020) 20 tablet 0   No current facility-administered medications for this visit.    No Known Allergies  Review of Systems  Constitutional: Positive for activity change and unexpected weight change (Has lost 10 pounds in the last few weeks). Negative for fatigue.  Respiratory: Negative for cough, shortness of breath and wheezing.   Cardiovascular: Negative for chest pain.  Gastrointestinal: Positive for abdominal pain and anal bleeding.  Genitourinary: Negative for dysuria.  Musculoskeletal: Positive for gait problem.  Neurological: Positive for speech difficulty and weakness (Right-sided).  Hematological: Bruises/bleeds easily.  Psychiatric/Behavioral: Positive for dysphoric mood. The patient is nervous/anxious.     BP (!) 189/74 (BP Location: Left Arm, Patient Position: Sitting)   Pulse 70   Resp 20   Ht 5\' 1"  (1.549 m)   Wt 134 lb (60.8 kg)   SpO2 95% Comment: RA with mask on  BMI 25.32 kg/m  Physical Exam Vitals reviewed.  Constitutional:      General: Kimberly Mora is not in acute distress.    Appearance: Normal appearance.  HENT:     Head: Normocephalic and atraumatic.  Eyes:     Extraocular Movements: Extraocular movements intact.  Cardiovascular:      Rate and Rhythm: Normal rate and regular rhythm.     Heart sounds: Murmur (2/6 with systolic and diastolic components at right upper sternal border) heard.    Pulmonary:     Effort: Pulmonary effort is normal. No respiratory distress.     Breath sounds: Normal breath sounds. No wheezing or rales.  Abdominal:     General: There is no distension.     Palpations: Abdomen is soft.  Musculoskeletal:        General: No swelling.  Skin:    General: Skin is warm and dry.  Neurological:     Mental Status: Kimberly Mora is alert and oriented to person, place, and time.  Motor: Weakness (Right sided) present.    Diagnostic Tests: Echocardiogram 06/25/2020 IMPRESSIONS    1. Left ventricular ejection fraction, by estimation, is 55 to 60%. The  left ventricle has normal function. The left ventricle has no regional  wall motion abnormalities. Left ventricular diastolic parameters are  consistent with Grade I diastolic  dysfunction (impaired relaxation).  2. Right ventricular systolic function is low normal. The right  ventricular size is normal.  3. The aortic valve is tricuspid. Aortic valve regurgitation is moderate.  No holodiastolic flow reversal.  4. There is severe dilatation of the ascending aorta, measuring 57 mm.  Ascending Aortic Index 3.8- Severely Increased. Normal descending aorta  diameter seen in subcostal view. No spectral Doppler evidence of  coarcation of the aorta. No dissection flap  visualized. Aortic dilatation noted.  5. The inferior vena cava is normal in size with greater than 50%  respiratory variability, suggesting right atrial pressure of 3 mmHg.  6. The mitral valve is grossly normal. Trivial mitral valve  regurgitation.   CT ANGIOGRAPHY CHEST WITH CONTRAST  TECHNIQUE: Multidetector CT imaging of the chest was performed using the standard protocol during bolus administration of intravenous contrast. Multiplanar CT image reconstructions and MIPs  were obtained to evaluate the vascular anatomy.  CONTRAST:  OMNIPAQUE IOHEXOL 350 MG/ML SOLN  COMPARISON:  None.  FINDINGS: Cardiovascular: Aortic root measures 3.9 cm at the sino-tubular junction. Ascending thoracic aorta measures 4.8 cm. Normal heart size. No pericardial effusion. There are eccentric filling defects within some of the left lower lobe lobar and proximal segmental branches.  Mediastinum/Nodes: No enlarged lymph nodes. Thyroid is small. Esophagus is unremarkable.  Lungs/Pleura: Mild centrilobular emphysema. No consolidation. 3 mm semi-solid nodular opacity of the left upper lobe (series 10, image 46). No pleural effusion.  Upper Abdomen: No acute abnormality.  Musculoskeletal: No acute osseous abnormality.  Review of the MIP images confirms the above findings.  IMPRESSION: Ascending thoracic aortic aneurysm. Recommend semi-annual imaging followup by CTA or MRA. This recommendation follows 2010 ACCF/AHA/AATS/ACR/ASA/SCA/SCAI/SIR/STS/SVM Guidelines for the Diagnosis and Management of Patients With Thoracic Aortic Disease. Circulation. 2010; 121: B638-L373. Aortic aneurysm NOS (ICD10-I71.9)  3 mm semi-solid left upper lobe nodule. No follow-up recommended. This recommendation follows the consensus statement: Guidelines for Management of Incidental Pulmonary Nodules Detected on CT Images: From the Fleischner Society 2017; Radiology 2017; 284:228-243.  Mild centrilobular emphysema.  Electronically Signed: By: Guadlupe Spanish M.D. On: 06/26/2020 11:44 ADDENDUM: Not included in the initial impression: Eccentric filling defects within some left lower lobe lobar and proximal segmental branches likely reflect chronic pulmonary emboli.   Electronically Signed   By: Guadlupe Spanish M.D.   On: 06/27/2020 08:27 I personally reviewed the CT images.  There is a large ascending aortic aneurysm.  Measurements very due to the significant curvature,  but I feel 4.8 is an underestimate.  My measurements are in the 5.3 to 5.4 cm range.  Impression: Kimberly Mora is a 80 year old woman with a past medical history significant for CAD, MI, stent, stroke, right hemiparesis, hypertension, hypothyroidism, cholelithiasis, anxiety and depression, and chronic pulmonary emboli.    Kimberly Mora was being evaluated for elective cholecystectomy.  A echocardiogram showed moderate AI, normal left-ventricular function, and an ascending aneurysm.  That led to a CT of the chest which also demonstrated an ascending thoracic aortic aneurysm.  The CT reading was 4.8 cm.  Not sure exactly what that part of the aorta was selected for measurement but my measurements are 5.3 to 5.4  cm.  There is no indication for urgent surgery at this time.  Kimberly Mora would be a poor candidate for surgery for the thoracic aneurysm in general given her age, prior stroke, and physical limitations.  Kimberly Mora does need follow-up and will plan to do a CT angio in 6 months.  If Kimberly Mora moves to an independent living facility in [place] as the daughter indicated, Kimberly Mora could be followed up there.  I did encourage them to consider whether Kimberly Mora would want to go through emergency surgery should Kimberly Mora have a dissection or rupture.  There is no contraindication to proceeding with a cholecystectomy.  There is some anecdotal data suggesting Kimberly Mora may be at higher risk for rupture or dissection of the ascending aortic aneurysm in the postoperative period, but if anything that risk would be even higher should Kimberly Mora have acute cholecystitis and require urgent surgery.  The greatest concern is her blood pressure is markedly elevated.  Her medications were recently changed.  Her systolic today was 189.  Her daughter said they checked it this morning and it was elevated then as well.  They will check on that after leaving the office.  Kimberly Mora was advised to call 911 immediately if Kimberly Mora were to have acute onset of chest or upper back  pain.  Plan: Contact Dr. Shari Prows and Herbie Baltimore regarding blood pressure Return in 6 months with CT angio chest  Loreli Slot, MD [facility] [phone]

## 2020-07-03 NOTE — Telephone Encounter (Signed)
Unfortunately her pressures have been quite high of late.  They were well controlled when I saw her in the office.  We may need to just continue to follow her blood blood pressure blood pressure log.  For now was cut back to her previous dose of verapamil and stop the metoprolol, continue losartan.   Bryan Lemma, MD

## 2020-07-03 NOTE — Telephone Encounter (Signed)
Spoke with patient's daughter Verlon Au. Patient seen by cardiothoracic surgeon this morning and advised to contact Dr. Elissa Hefty office to get blood pressure under control. On 06/25/20 Verpamil stopped, Losartan 25mg  QD and Metoprolol 25mg  QD started. Patient's blood pressure was 189/74, HR of 70 this morning. Previous blood pressures since visit 1/25 have been:  180/82 182/100 188/96 189/99, 63 208/97, 71 Patient is asymptomatic.   Will route to MD to advise.

## 2020-07-03 NOTE — Addendum Note (Signed)
Addended by: Tobin Chad on: 07/03/2020 01:04 PM   Modules accepted: Orders

## 2020-07-09 ENCOUNTER — Ambulatory Visit: Payer: Self-pay | Admitting: General Surgery

## 2020-07-10 ENCOUNTER — Ambulatory Visit: Payer: Self-pay | Admitting: Surgery

## 2020-07-10 NOTE — H&P (Signed)
General Surgery [facility]  Kimberly Mora DOB: 04-Oct-1940 Divorced / Language: [place] / Race: White Female   History of Present Illness   The patient is a 80 year old female who presents for evaluation of gall stones.  CHIEF COMPLAINT: ER referral for gallstones, hemorrhoids  Patient is referred by Dr. Baxter Hire Ward in the emergency department for evaluation of right upper quadrant abdominal pain, gallstones, and rectal bleeding. Patient was seen in the emergency department on December 17. She presented with a history of intermittent right upper quadrant abdominal pain which had become more persistent. Patient underwent CT scan of the abdomen which demonstrated cholelithiasis. There were no other acute findings. Laboratory studies showed a normal CBC and metabolic profile. Lipase was normal. Patient also was complaining of rectal bleeding with each bowel movement. Patient has a history of coronary artery disease and has a distant history of stent placement. She does take Plavix. Patient has relocated from [place] to [place] and now to [place] and is accompanied today by her daughter. She does not have a primary care physician or a cardiologist here in [place]. Previous abdominal surgery includes a percutaneous gastrostomy tube after her stroke. 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 She denies any other abdominal surgery. She denies any history of hepatobiliary or pancreatic disease. She denies jaundice. She denies any history of hepatitis or pancreatitis. Patient is wheelchair bound and lives alone.   Past Surgical History  Nephrectomy  Right.  Diagnostic Studies History  Colonoscopy  >10 years ago Mammogram  >3 years ago Pap Smear  >5 years ago  Allergies  No Known Drug Allergies  Allergies Reconciled   Medication History  Zolpidem Tartrate (5MG  Tablet, Oral) Active. Hydrocortisone  (Perianal) (2.5% Cream, External) Active. Ondansetron (4MG  Tablet Disint, Oral) Active. Synthroid ( Tablet, Oral) Active. Verapamil HCl (120MG  Tablet, Oral) Active. Medications Reconciled  Social History  Alcohol use  Occasional alcohol use. Caffeine use  Coffee, Tea. No drug use  Tobacco use  Former smoker.  Family History  Heart Disease  Father. Hypertension  Father, Mother, Sister. Prostate Cancer  Father. Thyroid problems  Daughter, Mother, Sister.  Pregnancy / Birth History  Age at menarche  13 years. Contraceptive History  Intrauterine device, Oral contraceptives. Gravida  4 Maternal age  30-25 Para  4  Other Problems  Cerebrovascular Accident  Cholelithiasis  Hemorrhoids  High blood pressure  Hypercholesterolemia  Myocardial infarction  Thyroid Disease   Review of Systems General Not Present- Appetite Loss, Chills, Fatigue, Fever, Night Sweats, Weight Gain and Weight Loss. Skin Present- Dryness. Not Present- Change in Wart/Mole, Hives, Jaundice, New Lesions, Non-Healing Wounds, Rash and Ulcer. HEENT Not Present- Earache, Hearing Loss, Hoarseness, Nose Bleed, Oral Ulcers, Ringing in the Ears, Seasonal Allergies, Sinus Pain, Sore Throat, Visual Disturbances, Wears glasses/contact lenses and Yellow Eyes. Respiratory Not Present- Bloody sputum, Chronic Cough, Difficulty Breathing, Snoring and Wheezing. Cardiovascular Not Present- Chest Pain, Difficulty Breathing Lying Down, Leg Cramps, Palpitations, Rapid Heart Rate, Shortness of Breath and Swelling of Extremities. Gastrointestinal Present- Abdominal Pain, Bloating and Hemorrhoids. Not Present- Bloody Stool, Change in Bowel Habits, Chronic diarrhea, Constipation, Difficulty Swallowing, Excessive gas, Gets full quickly at meals, Indigestion, Nausea, Rectal Pain and Vomiting. Female Genitourinary Not Present- Frequency, Nocturia, Painful Urination, Pelvic Pain and Urgency. Musculoskeletal Not  Present- Back Pain, Joint Pain, Joint Stiffness, Muscle Pain, Muscle Weakness and Swelling of Extremities. Neurological Present- Trouble walking. Not Present- Decreased Memory, Fainting, Headaches, Numbness, Seizures,  Tingling, Tremor and Weakness. Psychiatric Not Present- Anxiety, Bipolar, Change in Sleep Pattern, Depression, Fearful and Frequent crying. Endocrine Not Present- Cold Intolerance, Excessive Hunger, Hair Changes, Heat Intolerance, Hot flashes and New Diabetes. Hematology Present- Blood Thinners. Not Present- Easy Bruising, Excessive bleeding, Gland problems, HIV and Persistent Infections.  Vitals Weight: 141.13 lb Height: 62in Body Surface Area: 1.65 m Body Mass Index: 25.81 kg/m  Temp.: 98.39F  Pulse: 80 (Regular)  P.OX: 90% (Room air) BP: 142/88(Sitting, Left Arm, Standard)  Physical Exam   GENERAL APPEARANCE Development: normal Nutritional status: normal  SKIN Rash, lesions, ulcers: none Induration, erythema: none Nodules: none palpable  EYES Conjunctiva and lids: normal Pupils: equal and reactive Iris: normal bilaterally  EARS, NOSE, MOUTH, THROAT External ears: no lesion or deformity External nose: no lesion or deformity Hearing: grossly normal Due to Covid-19 pandemic, patient is wearing a mask.  NECK Symmetric: yes Trachea: midline Thyroid: no palpable nodules in the thyroid bed  CHEST Respiratory effort: normal Retraction or accessory muscle use: no Breath sounds: normal bilaterally Rales, rhonchi, wheeze: none  CARDIOVASCULAR Auscultation: regular rhythm, normal rate Murmurs: none Pulses: radial pulse 2+ palpable Lower extremity edema: none  ABDOMEN Distension: none Masses: none palpable Tenderness: none Hepatosplenomegaly: not present Hernia: not present Healed surgical wound left upper quadrant consistent with gastrostomy  RECTAL External examination shows multiple circumferential anal skin tags. There is prolapsed  tissue from the rectum representing either chronic prolapsed hemorrhoids or polyps. There is no active bleeding. Digital examination shows hard stool in the rectal vault but no palpable masses.  MUSCULOSKELETAL Patient is examined recumbent on the examining table. She has a right hemiparesis.  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  Patient is examined in the office today by myself and by my partner, Dr. Romie Levee from colorectal surgery. She will dictate a separate report from today's visit.  As for the abdominal pain, the patient will require further evaluation. We will obtain an ultrasound of the abdomen to better evaluate the gallbladder and biliary tract. She may need additional studies.  Patient will require cardiology evaluation. She has a history of coronary artery disease and has a stent in place and is on Plavix. She will need cardiac assessment prior to any surgical intervention. We will make arrangements for consultation in the near future.  Patient will likely come to surgery for cholecystectomy and management of rectal bleeding. We will proceed with scheduling once cardiac evaluation is completed and additional diagnostic study results are reviewed.  Patient and her daughter are provided with written literature regarding gallbladder surgery to review at  home.  Patient is advised to begin taking a daily dose of MiraLAX. They may increase or decrease his dosage as necessary in order to keep her bowel movements soft and regular.  Darnell Level, MD [facility] Office: [phone]

## 2020-08-02 ENCOUNTER — Ambulatory Visit: Payer: BLUE CROSS/BLUE SHIELD | Admitting: Cardiology

## 2020-08-30 NOTE — Progress Notes (Addendum)
PCP - Doctors making house call  Cardiologist -  LOV 06-21-20 Bryan Lemma clearance see note 09-02-20 epic CT surgery consult 07-03-20 epic PPM/ICD -  Device Orders -  Rep Notified -   CTA chest- 06-27-20 epic Chest x-ray -  EKG - 06-21-20 epic Stress Test -  ECHO - 06-25-20 epic Cardiac Cath -   Sleep Study -  CPAP -   Fasting Blood Sugar -  Checks Blood Sugar _____ times a day  Blood Thinner Instructions:Elequis Aspirin Instructions:  ERAS Protcol - PRE-SURGERY Ensure or G2-   COVID TEST- 09-09-20 Activity---does ADL's without SOB  Anesthesia review: MI, CVA RUE paralysis, MI 2014 CAD stent , Thoracic aortic aneurysm , Aortic arch aneurysm  Patient denies shortness of breath, fever, cough and chest pain at PAT appointment   All instructions explained to the patient, with a verbal understanding of the material. Patient agrees to go over the instructions while at home for a better understanding. Patient also instructed to self quarantine after being tested for COVID-19. The opportunity to ask questions was provided.

## 2020-08-30 NOTE — Patient Instructions (Signed)
DUE TO COVID-19 ONLY ONE VISITOR IS ALLOWED TO COME WITH YOU AND STAY IN THE WAITING ROOM ONLY DURING PRE OP AND PROCEDURE DAY OF SURGERY.  TWO VISITOR  MAY VISIT WITH YOU AFTER SURGERY IN YOUR PRIVATE ROOM DURING VISITING HOURS ONLY!  YOU NEED TO HAVE A COVID 19 TEST ON__4-12-22_____ @_______ , THIS TEST MUST BE DONE BEFORE SURGERY,  COVID TESTING SITE [address] [place]  , IT IS ON THE RIGHT GOING OUT [address] APPROXIMATELY  2 MINUTES PAST [employer] ON THE RIGHT. ONCE YOUR COVID TEST IS COMPLETED,  PLEASE BEGIN THE QUARANTINE INSTRUCTIONS AS OUTLINED IN YOUR HANDOUT.                DILYNN MUNROE  08/30/2020   Your procedure is scheduled on: 09-12-20   Report to [facility] Main  Entrance   Report to short stay at       0515  AM     Call this number if you have problems the morning of surgery [phone]    Remember: Do not eat food  :After Midnight. You may have clear liquids until 0430 am then nothing by mouth    CLEAR LIQUID DIET   Foods Allowed                                                                                     Foods Excluded  Black Coffee and tea, regular and decaf                             liquids that you cannot  Plain Jell-O any favor except red or purple                                           see through such as: Fruit ices (not with fruit pulp)                                                     milk, soups, orange juice  Iced Popsicles                                                          All solid food Carbonated beverages, regular and diet                                    Cranberry, grape and apple juices Sports drinks like Gatorade Lightly seasoned clear broth or consume(fat free) Sugar, honey syrup   _____________________________________________________________________    BRUSH YOUR TEETH MORNING OF SURGERY AND RINSE YOUR MOUTH OUT, NO CHEWING GUM CANDY  OR MINTS.     Take these  medicines the morning of surgery with A SIP OF WATER: verapamil, crestor, levothyroxine, lexapro                                 You may not have any metal on your body including hair pins and              piercings  Do not wear jewelry, make-up, lotions, powders or perfumes, deodorant             Do not wear nail polish on your fingernails.  Do not shave  48 hours prior to surgery.                Do not bring valuables to the hospital. [facility] IS NOT             RESPONSIBLE   FOR VALUABLES.  Contacts, dentures or bridgework may not be worn into surgery.      Patients discharged the day of surgery will not be allowed to drive home. IF YOU ARE HAVING SURGERY AND GOING HOME THE SAME DAY, YOU MUST HAVE AN ADULT TO DRIVE YOU HOME AND BE WITH YOU FOR 24 HOURS. YOU MAY GO HOME BY TAXI OR UBER OR ORTHERWISE, BUT AN ADULT MUST ACCOMPANY YOU HOME AND STAY WITH YOU FOR 24 HOURS.  Name and phone number of your driver:  Special Instructions: N/A              Please read over the following fact sheets you were given: _____________________________________________________________________             [facility] - Preparing for Surgery Before surgery, you can play an important role.  Because skin is not sterile, your skin needs to be as free of germs as possible.  You can reduce the number of germs on your skin by washing with CHG (chlorahexidine gluconate) soap before surgery.  CHG is an antiseptic cleaner which kills germs and bonds with the skin to continue killing germs even after washing. Please DO NOT use if you have an allergy to CHG or antibacterial soaps.  If your skin becomes reddened/irritated stop using the CHG and inform your nurse when you arrive at Short Stay. Do not shave (including legs and underarms) for at least 48 hours prior to the first CHG shower.  You may shave your face/neck. Please follow these instructions carefully:  1.  Shower with CHG Soap the night before surgery and  the  morning of Surgery.  2.  If you choose to wash your hair, wash your hair first as usual with your  normal  shampoo.  3.  After you shampoo, rinse your hair and body thoroughly to remove the  shampoo.                           4.  Use CHG as you would any other liquid soap.  You can apply chg directly  to the skin and wash                       Gently with a scrungie or clean washcloth.  5.  Apply the CHG Soap to your body ONLY FROM THE NECK DOWN.   Do not use on face/ open  Wound or open sores. Avoid contact with eyes, ears mouth and genitals (private parts).                       Wash face,  Genitals (private parts) with your normal soap.             6.  Wash thoroughly, paying special attention to the area where your surgery  will be performed.  7.  Thoroughly rinse your body with warm water from the neck down.  8.  DO NOT shower/wash with your normal soap after using and rinsing off  the CHG Soap.                9.  Pat yourself dry with a clean towel.            10.  Wear clean pajamas.            11.  Place clean sheets on your bed the night of your first shower and do not  sleep with pets. Day of Surgery : Do not apply any lotions/deodorants the morning of surgery.  Please wear clean clothes to the hospital/surgery center.  FAILURE TO FOLLOW THESE INSTRUCTIONS MAY RESULT IN THE CANCELLATION OF YOUR SURGERY PATIENT SIGNATURE_________________________________  NURSE SIGNATURE__________________________________  ________________________________________________________________________

## 2020-09-02 ENCOUNTER — Ambulatory Visit: Payer: Self-pay | Admitting: General Surgery

## 2020-09-02 ENCOUNTER — Encounter (HOSPITAL_COMMUNITY): Payer: Self-pay

## 2020-09-02 ENCOUNTER — Telehealth: Payer: Self-pay | Admitting: Cardiology

## 2020-09-02 ENCOUNTER — Other Ambulatory Visit: Payer: Self-pay

## 2020-09-02 ENCOUNTER — Encounter (HOSPITAL_COMMUNITY)
Admission: RE | Admit: 2020-09-02 | Discharge: 2020-09-02 | Disposition: A | Discharge status: Home / Self Care | Payer: Medicare Other | Source: Ambulatory Visit | Attending: Surgery | Admitting: Surgery

## 2020-09-02 DIAGNOSIS — Z7901 Long term (current) use of anticoagulants: Secondary | ICD-10-CM | POA: Insufficient documentation

## 2020-09-02 DIAGNOSIS — Z79899 Other long term (current) drug therapy: Secondary | ICD-10-CM | POA: Insufficient documentation

## 2020-09-02 DIAGNOSIS — I2510 Atherosclerotic heart disease of native coronary artery without angina pectoris: Secondary | ICD-10-CM | POA: Insufficient documentation

## 2020-09-02 DIAGNOSIS — I714 Abdominal aortic aneurysm, without rupture: Secondary | ICD-10-CM | POA: Diagnosis not present

## 2020-09-02 DIAGNOSIS — K811 Chronic cholecystitis: Secondary | ICD-10-CM | POA: Insufficient documentation

## 2020-09-02 DIAGNOSIS — Z87891 Personal history of nicotine dependence: Secondary | ICD-10-CM | POA: Diagnosis not present

## 2020-09-02 DIAGNOSIS — I351 Nonrheumatic aortic (valve) insufficiency: Secondary | ICD-10-CM | POA: Insufficient documentation

## 2020-09-02 DIAGNOSIS — Z79890 Hormone replacement therapy (postmenopausal): Secondary | ICD-10-CM | POA: Diagnosis not present

## 2020-09-02 DIAGNOSIS — I10 Essential (primary) hypertension: Secondary | ICD-10-CM | POA: Insufficient documentation

## 2020-09-02 DIAGNOSIS — Z96641 Presence of right artificial hip joint: Secondary | ICD-10-CM | POA: Insufficient documentation

## 2020-09-02 DIAGNOSIS — I69351 Hemiplegia and hemiparesis following cerebral infarction affecting right dominant side: Secondary | ICD-10-CM | POA: Insufficient documentation

## 2020-09-02 DIAGNOSIS — Z955 Presence of coronary angioplasty implant and graft: Secondary | ICD-10-CM | POA: Diagnosis not present

## 2020-09-02 DIAGNOSIS — Z01812 Encounter for preprocedural laboratory examination: Secondary | ICD-10-CM | POA: Insufficient documentation

## 2020-09-02 LAB — BASIC METABOLIC PANEL
Anion gap: 8 (ref 5–15)
BUN: 8 mg/dL (ref 8–23)
CO2: 25 mmol/L (ref 22–32)
Calcium: 9 mg/dL (ref 8.9–10.3)
Chloride: 110 mmol/L (ref 98–111)
Creatinine, Ser: 0.96 mg/dL (ref 0.44–1.00)
GFR, Estimated: 60 mL/min — ABNORMAL LOW (ref >60)
Glucose, Bld: 87 mg/dL (ref 70–99)
Potassium: 3.9 mmol/L (ref 3.5–5.1)
Sodium: 143 mmol/L (ref 135–145)

## 2020-09-02 LAB — CBC
HCT: 34.3 % — ABNORMAL LOW (ref 36.0–46.0)
Hemoglobin: 10.9 g/dL — ABNORMAL LOW (ref 12.0–15.0)
MCH: 26 pg (ref 26.0–34.0)
MCHC: 31.8 g/dL (ref 30.0–36.0)
MCV: 81.9 fL (ref 80.0–100.0)
Platelets: 289 10*3/uL (ref 150–400)
RBC: 4.19 MIL/uL (ref 3.87–5.11)
RDW: 16.2 % — ABNORMAL HIGH (ref 11.5–15.5)
WBC: 5.8 10*3/uL (ref 4.0–10.5)
nRBC: 0 % (ref 0.0–0.2)

## 2020-09-02 NOTE — Telephone Encounter (Signed)
   Patient Name: Kimberly Mora  DOB: December 19, 1940  MRN: [id number]   Primary Cardiologist: Bryan Lemma, MD  Chart reviewed as part of pre-operative protocol coverage. Patient was seen by Dr. Herbie Baltimore in 06/2020 for pre-op evaluation for upcoming gallbladder surgery. Plan was to proceed with surgery as long as Echo was unremarkable. Echo showed normal LV function with moderate AI and severe ascending aorta aneurysm measuring 33mm. Therefore, patient was referred to CT surgery before gallbladder surgery. She was seen on 07/03/2020 by Dr. Dorris Fetch who recommended repeat chest CTA in 6 months but felt like she was OK to proceed with cholecystectomy. Patient was contacted today for further pre-op evaluation. I spoke with her daughter, Kimberly Mora, who is her healthcare power of attorney (patient has history of stroke and per daughter has trouble relaying information). Daughter states she has been doing well from a cardiac standpoint since her last visit with Dr. Herbie Baltimore with no significant changes. No chest pain or shortness of breath. She is having a lot of pain secondary to her gallbladder issues though. Given past medical history and time since last visit, based on ACC/AHA guidelines, [name] would be at acceptable risk for the planned procedure without further cardiovascular testing.   Per Pharmacy, Eliquis can be held for 2-3 days prior to procedure. This should be restarted as soon as possible postoperatively.  I will route this recommendation to the requesting party via Epic fax function and remove from pre-op pool.  Please call with questions.  Corrin Parker, PA-C 09/02/2020, 3:17 PM

## 2020-09-02 NOTE — Telephone Encounter (Signed)
   [facility] Pre-operative Risk Assessment    Patient Name: Kimberly Mora  DOB: 05-Mar-1941  MRN: [id number]   HEARTCARE STAFF: - Please ensure there is not already an duplicate clearance open for this procedure. - Under Visit Info/Reason for Call, type in Other and utilize the format Clearance MM/DD/YY or Clearance TBD. Do not use dashes or single digits. - If request is for dental extraction, please clarify the # of teeth to be extracted.  Request for surgical clearance:  1. What type of surgery is being performed? Gallbladder removed   2. When is this surgery scheduled? 09-12-20   3. What type of clearance is required (medical clearance vs. Pharmacy clearance to hold med vs. Both)? Medicine 4.  5. Are there any medications that need to be held prior to surgery and how long? Eliquis 6.    7. Practice name and name of physician performing surgery? Dr Harlow Asa   8. What is the office phone number? [phone]   7.   What is the office fax number?  [phone]  8.   Anesthesia type (None, local, MAC, general) ? General   Glyn Ade 09/02/2020, 1:49 PM  _________________________________________________________________   (provider comments below)

## 2020-09-02 NOTE — Progress Notes (Signed)
Spoke with Toniann Fail RN at [facility]  Regarding pt. Instructions for Elequis regarding surgery and for order from Dr. Maisie Fus. Toniann Fail will let Dr. Maisie Fus know about orders and contact pt. Regarding Elequis.

## 2020-09-02 NOTE — Telephone Encounter (Signed)
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  Procedure: gallbladder removed  Date of procedure: 09/12/20  CrCl 6mL/min using SCr from January (BMET today hasn't resulted yet) Platelet count 289K  When pt was started on Eliquis in January, Dr February mentioned that pt could hold for upcoming surgery (06/26/20 phone note). Would recommend holding Eliquis for 2-3 days prior to gallbladder removal.

## 2020-09-02 NOTE — Telephone Encounter (Signed)
Pharmacy, can you please comment on how long Eliquis should be held prior to surgery?  Thank you!

## 2020-09-04 NOTE — Progress Notes (Signed)
Anesthesia Chart Review   Case: [id number] Date/Time: 09/12/20 0715   Procedures:      LAPAROSCOPIC CHOLECYSTECTOMY WITH INTRAOPERATIVE CHOLANGIOGRAM (N/A ) - 120 TOTAL TIME PLEASE     EXCISION OF ANAL POLYP (N/A )   Anesthesia type: General   Pre-op diagnosis: chronic cholecystitis   Location: [facility] ROOM 05 / [facility]   Surgeons: Darnell Level, MD      DISCUSSION:80 y.o. 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 Darnell Level.   Per cardiology preoperative evaluation 09/02/2020, "Chart reviewed as part of pre-operative protocol coverage. Patient was seen by Dr. Herbie Baltimore in 06/2020 for pre-op evaluation for upcoming gallbladder surgery. Plan was to proceed with surgery as long as Echo was unremarkable. Echo showed normal LV function with moderate AI and severe ascending aorta aneurysm measuring 58mm. Therefore, patient was referred to CT surgery before gallbladder surgery. She was seen on 07/03/2020 by Dr. Dorris Fetch who recommended repeat chest CTA in 6 months but felt like she was OK to proceed with cholecystectomy. Patient was contacted today for further pre-op evaluation. I spoke with her daughter, Kimberly Mora, who is her healthcare power of attorney (patient has history of stroke and per daughter has trouble relaying information). Daughter states she has been doing well from a cardiac standpoint since her last visit with Dr. Herbie Baltimore with no significant changes. No chest pain or shortness of breath. She is having a lot of pain secondary to her gallbladder issues though. Given past medical history and time since last visit, based on ACC/AHA guidelines, Kimberly Mora would be at acceptable risk for the planned procedure without further cardiovascular testing.  Per Pharmacy, Eliquis can be held for 2-3 days prior to procedure. This should be restarted as soon as possible postoperatively."  Per  cardiothoracic surgeon note 07/03/20, "There is no contraindication to proceeding with a cholecystectomy.  There is some anecdotal data suggesting she may be at higher risk for rupture or dissection of the ascending aortic aneurysm in the postoperative period, but if anything that risk would be even higher should she have acute cholecystitis and require urgent surgery."  Anticipate pt can proceed with planned procedure barring acute status change.   VS: BP (!) 142/59   Pulse 76   Temp 36.7 C (Oral)   Resp 16   Ht 5\' 1"  (1.549 m)   Wt 58.1 kg   SpO2 98%   BMI 24.19 kg/m   PROVIDERS: Patient, No Pcp Per (Inactive)  , MD is Cardiologist  LABS: Labs reviewed: Acceptable for surgery. (all labs ordered are listed, but only abnormal results are displayed)  Labs Reviewed  CBC - Abnormal; Notable for the following components:      Result Value   Hemoglobin 10.9 (*)    HCT 34.3 (*)    RDW 16.2 (*)    All other components within normal limits  BASIC METABOLIC PANEL - Abnormal; Notable for the following components:   GFR, Estimated 60 (*)    All other components within normal limits     IMAGES:   EKG: 06/21/20 Rate 69 bpm  NSR Inferior infarct, age undetermined Anterolateral infarct, age undetermined  CV: Echo 06/25/2020 1. Left ventricular ejection fraction, by estimation, is 55 to 60%. The  left ventricle has normal function. The left ventricle has no regional  wall motion abnormalities. Left ventricular diastolic parameters are  consistent with Grade I diastolic  dysfunction (impaired relaxation).  2. Right ventricular systolic function is low normal. The right  ventricular size is normal.  3. The aortic valve is tricuspid. Aortic valve regurgitation is moderate.  No holodiastolic flow reversal.  4. There is severe dilatation of the ascending aorta, measuring 57 mm.  Ascending Aortic Index 3.8- Severely Increased. Normal descending aorta  diameter seen in  subcostal view. No spectral Doppler evidence of  coarcation of the aorta. No dissection flap  visualized. Aortic dilatation noted.  5. The inferior vena cava is normal in size with greater than 50%  respiratory variability, suggesting right atrial pressure of 3 mmHg.  6. The mitral valve is grossly normal. Trivial mitral valve  regurgitation.  Past Medical History:  Diagnosis Date  . Anxiety with depression    On Lexapro  . Bleeding internal hemorrhoids 05/2020  . CAD S/P percutaneous coronary angioplasty 2014   In setting of an MI  . Cholelithiasis without cholecystitis 05/2020  . Hypertension   . Hypothyroidism (acquired)    On Synthroid  . MI (myocardial infarction) [facility]) 2014   Details are not clear: She was transferred from the local hospital (presumably via helicopter) to [place], [place] & did have Cardiac Cath - PCI. (report not available).  . Stroke [facility])    rt sided deficits, aphasia => Per patient and daughter, the details of this event are not completely sure.  She was found covered with blood and had a skull fracture.  There is question of potential valve collapse.    Past Surgical History:  Procedure Laterality Date  . CORONARY ANGIOPLASTY WITH STENT PLACEMENT  2014   In setting of AMI - unclear details; Report not available.  Marland Kitchen GASTROSTOMY TUBE PLACEMENT  2016   Following the stroke; removed upon ability to take p.o.  Marland Kitchen JOINT REPLACEMENT     right hip rerplacement    MEDICATIONS: . apixaban (ELIQUIS) 5 MG TABS tablet  . cetirizine (ZYRTEC) 10 MG tablet  . docusate sodium (COLACE) 100 MG capsule  . escitalopram (LEXAPRO) 20 MG tablet  . fluticasone (FLONASE) 50 MCG/ACT nasal spray  . HYDROcodone-acetaminophen (NORCO/VICODIN) 5-325 MG tablet  . hydrocortisone (ANUSOL-HC) 2.5 % rectal cream  . levothyroxine (SYNTHROID) 75 MCG tablet  . losartan (COZAAR) 25 MG tablet  . ondansetron (ZOFRAN ODT) 4 MG disintegrating tablet  . rosuvastatin (CRESTOR) 40 MG  tablet  . verapamil (CALAN) 120 MG tablet  . zolpidem (AMBIEN) 5 MG tablet   No current facility-administered medications for this encounter.    Jodell Cipro, PA-C [facility] Pre-Surgical Testing [phone]

## 2020-09-04 NOTE — Anesthesia Preprocedure Evaluation (Addendum)
Anesthesia Evaluation  Patient identified by MRN, date of birth, ID band Patient awake    Reviewed: Allergy & Precautions, NPO status , Patient's Chart, lab work & pertinent test results  History of Anesthesia Complications Negative for: history of anesthetic complications  Airway Mallampati: I  TM Distance: >3 FB Neck ROM: Full    Dental  (+) Edentulous Upper, Implants, Caps, Dental Advisory Given   Pulmonary former smoker,  09/09/2020 SARS coronavirus NEG   breath sounds clear to auscultation       Cardiovascular hypertension, Pt. on medications + CAD, + Past MI (2014), + Cardiac Stents and + Peripheral Vascular Disease (Thoracic AA 5.4 cm)   Rhythm:Regular Rate:Normal + Systolic murmurs 08/4313 ECHO: normal LVF, EF 55-60%, mod AI, severe asc aortic aneurysm 5.7 cm   Neuro/Psych Anxiety Depression CVA (R hemiparesis), Residual Symptoms    GI/Hepatic Neg liver ROS, abd pain with cholecystitis   Endo/Other  Hypothyroidism   Renal/GU negative Renal ROS     Musculoskeletal   Abdominal   Peds  Hematology  (+) Blood dyscrasia (Hb 10.9), anemia ,   Anesthesia Other Findings   Reproductive/Obstetrics                           Anesthesia Physical Anesthesia Plan  ASA: IV  Anesthesia Plan: General   Post-op Pain Management:    Induction: Intravenous  PONV Risk Score and Plan: 3 and Ondansetron, Dexamethasone and Treatment may vary due to age or medical condition  Airway Management Planned: Oral ETT  Additional Equipment: None  Intra-op Plan:   Post-operative Plan: Extubation in OR  Informed Consent: I have reviewed the patients History and Physical, chart, labs and discussed the procedure including the risks, benefits and alternatives for the proposed anesthesia with the patient or authorized representative who has indicated his/her understanding and acceptance.     Dental advisory  given and Consent reviewed with POA  Plan Discussed with: Surgeon and CRNA  Anesthesia Plan Comments: (See PAT note 09/02/2020, Jodell Cipro, PA-C)      Anesthesia Quick Evaluation

## 2020-09-09 ENCOUNTER — Other Ambulatory Visit (HOSPITAL_COMMUNITY)
Admission: RE | Admit: 2020-09-09 | Discharge: 2020-09-09 | Disposition: A | Discharge status: Home / Self Care | Payer: Medicare Other | Source: Ambulatory Visit | Attending: Surgery | Admitting: Surgery

## 2020-09-09 DIAGNOSIS — Z20822 Contact with and (suspected) exposure to covid-19: Secondary | ICD-10-CM | POA: Diagnosis not present

## 2020-09-09 DIAGNOSIS — Z01812 Encounter for preprocedural laboratory examination: Secondary | ICD-10-CM | POA: Insufficient documentation

## 2020-09-10 ENCOUNTER — Encounter (HOSPITAL_COMMUNITY): Payer: Self-pay | Admitting: Surgery

## 2020-09-10 DIAGNOSIS — Z8719 Personal history of other diseases of the digestive system: Secondary | ICD-10-CM

## 2020-09-10 DIAGNOSIS — R1011 Right upper quadrant pain: Secondary | ICD-10-CM | POA: Diagnosis present

## 2020-09-10 LAB — SARS CORONAVIRUS 2 (TAT 6-24 HRS): SARS Coronavirus 2: NEGATIVE

## 2020-09-10 NOTE — H&P (Signed)
General Surgery [facility]  Loma Newton DOB: 1941/01/23 Divorced / Language: [place] / Race: White Female   History of Present Illness   The patient is a 80 year old female who presents for evaluation of gall stones.  CHIEF COMPLAINT: ER referral for gallstones, hemorrhoids  Patient is referred by Dr. Baxter Hire Ward in the emergency department for evaluation of right upper quadrant abdominal pain, gallstones, and rectal bleeding. Patient was seen in the emergency department on December 17. She presented with a history of intermittent right upper quadrant abdominal pain which had become more persistent. Patient underwent CT scan of the abdomen which demonstrated cholelithiasis. There were no other acute findings. Laboratory studies showed a normal CBC and metabolic profile. Lipase was normal. Patient also was complaining of rectal bleeding with each bowel movement. Patient has a history of coronary artery disease and has a distant history of stent placement. She does take Plavix. Patient has relocated from [place] to [place] and now to [place] and is accompanied today by her daughter. She does not have a primary care physician or a cardiologist here in [place]. Previous abdominal surgery includes a percutaneous gastrostomy tube after her stroke. 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 She denies any other abdominal surgery. She denies any history of hepatobiliary or pancreatic disease. She denies jaundice. She denies any history of hepatitis or pancreatitis. Patient is wheelchair bound and lives alone.   Past Surgical History  Nephrectomy  Right.  Diagnostic Studies History  Colonoscopy  >10 years ago Mammogram  >3 years ago Pap Smear  >5 years ago  Allergies  No Known Drug Allergies  Allergies Reconciled   Medication History  Zolpidem Tartrate (5MG  Tablet, Oral) Active. Hydrocortisone  (Perianal) (2.5% Cream, External) Active. Ondansetron (4MG  Tablet Disint, Oral) Active. Synthroid ( Tablet, Oral) Active. Verapamil HCl (120MG  Tablet, Oral) Active. Medications Reconciled  Social History  Alcohol use  Occasional alcohol use. Caffeine use  Coffee, Tea. No drug use  Tobacco use  Former smoker.  Family History Heart Disease  Father. Hypertension  Father, Mother, Sister. Prostate Cancer  Father. Thyroid problems  Daughter, Mother, Sister.  Pregnancy / Birth History  Age at menarche  13 years. Contraceptive History  Intrauterine device, Oral contraceptives. Gravida  4 Maternal age  60-25 Para  4  Other Problems  Cerebrovascular Accident  Cholelithiasis  Hemorrhoids  High blood pressure  Hypercholesterolemia  Myocardial infarction  Thyroid Disease   Review of Systems  General Not Present- Appetite Loss, Chills, Fatigue, Fever, Night Sweats, Weight Gain and Weight Loss. Skin Present- Dryness. Not Present- Change in Wart/Mole, Hives, Jaundice, New Lesions, Non-Healing Wounds, Rash and Ulcer. HEENT Not Present- Earache, Hearing Loss, Hoarseness, Nose Bleed, Oral Ulcers, Ringing in the Ears, Seasonal Allergies, Sinus Pain, Sore Throat, Visual Disturbances, Wears glasses/contact lenses and Yellow Eyes. Respiratory Not Present- Bloody sputum, Chronic Cough, Difficulty Breathing, Snoring and Wheezing. Cardiovascular Not Present- Chest Pain, Difficulty Breathing Lying Down, Leg Cramps, Palpitations, Rapid Heart Rate, Shortness of Breath and Swelling of Extremities. Gastrointestinal Present- Abdominal Pain, Bloating and Hemorrhoids. Not Present- Bloody Stool, Change in Bowel Habits, Chronic diarrhea, Constipation, Difficulty Swallowing, Excessive gas, Gets full quickly at meals, Indigestion, Nausea, Rectal Pain and Vomiting. Female Genitourinary Not Present- Frequency, Nocturia, Painful Urination, Pelvic Pain and Urgency. Musculoskeletal Not  Present- Back Pain, Joint Pain, Joint Stiffness, Muscle Pain, Muscle Weakness and Swelling of Extremities. Neurological Present- Trouble walking. Not Present- Decreased Memory, Fainting, Headaches, Numbness, Seizures,  Tingling, Tremor and Weakness. Psychiatric Not Present- Anxiety, Bipolar, Change in Sleep Pattern, Depression, Fearful and Frequent crying. Endocrine Not Present- Cold Intolerance, Excessive Hunger, Hair Changes, Heat Intolerance, Hot flashes and New Diabetes. Hematology Present- Blood Thinners. Not Present- Easy Bruising, Excessive bleeding, Gland problems, HIV and Persistent Infections.  Vitals  Weight: 141.13 lb Height: 62in Body Surface Area: 1.65 m Body Mass Index: 25.81 kg/m  Temp.: 98.61F  Pulse: 80 (Regular)  P.OX: 90% (Room air) BP: 142/88(Sitting, Left Arm, Standard)  Physical Exam   GENERAL APPEARANCE Development: normal Nutritional status: normal  SKIN Rash, lesions, ulcers: none Induration, erythema: none Nodules: none palpable  EYES Conjunctiva and lids: normal Pupils: equal and reactive Iris: normal bilaterally  EARS, NOSE, MOUTH, THROAT External ears: no lesion or deformity External nose: no lesion or deformity Hearing: grossly normal Due to Covid-19 pandemic, patient is wearing a mask.  NECK Symmetric: yes Trachea: midline Thyroid: no palpable nodules in the thyroid bed  CHEST Respiratory effort: normal Retraction or accessory muscle use: no Breath sounds: normal bilaterally Rales, rhonchi, wheeze: none  CARDIOVASCULAR Auscultation: regular rhythm, normal rate Murmurs: none Pulses: radial pulse 2+ palpable Lower extremity edema: none  ABDOMEN Distension: none Masses: none palpable Tenderness: none Hepatosplenomegaly: not present Hernia: not present Healed surgical wound left upper quadrant consistent with gastrostomy  RECTAL External examination shows multiple circumferential anal skin tags. There is prolapsed  tissue from the rectum representing either chronic prolapsed hemorrhoids or polyps. There is no active bleeding. Digital examination shows hard stool in the rectal vault but no palpable masses.  MUSCULOSKELETAL Patient is examined recumbent on the examining table. She has a right hemiparesis.  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  Patient is examined in the office today by myself and by my partner, Dr. Romie Levee from colorectal surgery. She will dictate a separate report from today's visit.  As for the abdominal pain, the patient will require further evaluation. We will obtain an ultrasound of the abdomen to better evaluate the gallbladder and biliary tract. She may need additional studies.  Patient will require cardiology evaluation. She has a history of coronary artery disease and has a stent in place and is on Plavix. She will need cardiac assessment prior to any surgical intervention. We will make arrangements for consultation in the near future.  Patient will likely come to surgery for cholecystectomy and management of rectal bleeding. We will proceed with scheduling once cardiac evaluation is completed and additional diagnostic study results are reviewed.  Darnell Level, MD [facility] Office: [phone]

## 2020-09-12 ENCOUNTER — Encounter (HOSPITAL_COMMUNITY): Payer: Self-pay | Admitting: Surgery

## 2020-09-12 ENCOUNTER — Ambulatory Visit (HOSPITAL_COMMUNITY)
Admission: RE | Admit: 2020-09-12 | Discharge: 2020-09-12 | Disposition: A | Discharge status: Home / Self Care | Payer: Medicare Other | Attending: Surgery | Admitting: Surgery

## 2020-09-12 ENCOUNTER — Ambulatory Visit (HOSPITAL_COMMUNITY): Payer: Medicare Other

## 2020-09-12 ENCOUNTER — Encounter (HOSPITAL_COMMUNITY)
Admission: RE | Disposition: A | Discharge status: Home / Self Care | Payer: Self-pay | Source: Home / Self Care | Attending: Surgery

## 2020-09-12 ENCOUNTER — Ambulatory Visit (HOSPITAL_COMMUNITY): Payer: Medicare Other | Admitting: Anesthesiology

## 2020-09-12 ENCOUNTER — Ambulatory Visit (HOSPITAL_COMMUNITY): Payer: Medicare Other | Admitting: Physician Assistant

## 2020-09-12 DIAGNOSIS — I2510 Atherosclerotic heart disease of native coronary artery without angina pectoris: Secondary | ICD-10-CM | POA: Insufficient documentation

## 2020-09-12 DIAGNOSIS — Z993 Dependence on wheelchair: Secondary | ICD-10-CM | POA: Diagnosis not present

## 2020-09-12 DIAGNOSIS — K620 Anal polyp: Secondary | ICD-10-CM | POA: Insufficient documentation

## 2020-09-12 DIAGNOSIS — K8010 Calculus of gallbladder with chronic cholecystitis without obstruction: Secondary | ICD-10-CM | POA: Diagnosis present

## 2020-09-12 DIAGNOSIS — K648 Other hemorrhoids: Secondary | ICD-10-CM | POA: Diagnosis not present

## 2020-09-12 DIAGNOSIS — Z419 Encounter for procedure for purposes other than remedying health state, unspecified: Secondary | ICD-10-CM

## 2020-09-12 DIAGNOSIS — Z87891 Personal history of nicotine dependence: Secondary | ICD-10-CM | POA: Insufficient documentation

## 2020-09-12 DIAGNOSIS — K828 Other specified diseases of gallbladder: Secondary | ICD-10-CM | POA: Diagnosis not present

## 2020-09-12 DIAGNOSIS — Z8349 Family history of other endocrine, nutritional and metabolic diseases: Secondary | ICD-10-CM | POA: Insufficient documentation

## 2020-09-12 DIAGNOSIS — Z7902 Long term (current) use of antithrombotics/antiplatelets: Secondary | ICD-10-CM | POA: Insufficient documentation

## 2020-09-12 DIAGNOSIS — K626 Ulcer of anus and rectum: Secondary | ICD-10-CM | POA: Insufficient documentation

## 2020-09-12 DIAGNOSIS — I69351 Hemiplegia and hemiparesis following cerebral infarction affecting right dominant side: Secondary | ICD-10-CM | POA: Insufficient documentation

## 2020-09-12 DIAGNOSIS — Z955 Presence of coronary angioplasty implant and graft: Secondary | ICD-10-CM | POA: Diagnosis not present

## 2020-09-12 DIAGNOSIS — Z8249 Family history of ischemic heart disease and other diseases of the circulatory system: Secondary | ICD-10-CM | POA: Insufficient documentation

## 2020-09-12 DIAGNOSIS — K625 Hemorrhage of anus and rectum: Secondary | ICD-10-CM | POA: Diagnosis not present

## 2020-09-12 DIAGNOSIS — Z8719 Personal history of other diseases of the digestive system: Secondary | ICD-10-CM

## 2020-09-12 DIAGNOSIS — R1011 Right upper quadrant pain: Secondary | ICD-10-CM | POA: Diagnosis present

## 2020-09-12 HISTORY — PX: CHOLECYSTECTOMY: SHX55

## 2020-09-12 HISTORY — PX: EXCISION OF KELOID: SHX6267

## 2020-09-12 SURGERY — LAPAROSCOPIC CHOLECYSTECTOMY WITH INTRAOPERATIVE CHOLANGIOGRAM
Anesthesia: General

## 2020-09-12 MED ORDER — ORAL CARE MOUTH RINSE: 15.0000 mL | Freq: Once | OROMUCOSAL | Status: AC

## 2020-09-12 MED ORDER — OXYCODONE HCL 5 MG PO TABS: 5.0000 mg | ORAL_TABLET | Freq: Once | ORAL | Status: DC | PRN

## 2020-09-12 MED ORDER — CHLORHEXIDINE GLUCONATE CLOTH 2 % EX PADS: 6.0000 | MEDICATED_PAD | Freq: Once | CUTANEOUS | Status: DC

## 2020-09-12 MED ORDER — FENTANYL CITRATE (PF) 100 MCG/2ML IJ SOLN
INTRAMUSCULAR | Status: AC
  Filled 2020-09-12: qty 2

## 2020-09-12 MED ORDER — ROCURONIUM BROMIDE 10 MG/ML (PF) SYRINGE
PREFILLED_SYRINGE | INTRAVENOUS | Status: DC | PRN
  Administered 2020-09-12: 60 mg via INTRAVENOUS

## 2020-09-12 MED ORDER — BUPIVACAINE LIPOSOME 1.3 % IJ SUSP
20.0000 mL | Freq: Once | INTRAMUSCULAR | Status: AC
  Administered 2020-09-12: 20 mL
  Filled 2020-09-12: qty 20

## 2020-09-12 MED ORDER — LIDOCAINE 2% (20 MG/ML) 5 ML SYRINGE
INTRAMUSCULAR | Status: AC
  Filled 2020-09-12: qty 5

## 2020-09-12 MED ORDER — OXYCODONE HCL 5 MG PO TABS: 5.0000 mg | ORAL_TABLET | Freq: Four times a day (QID) | ORAL | 0 refills | Status: AC | PRN

## 2020-09-12 MED ORDER — ROCURONIUM BROMIDE 10 MG/ML (PF) SYRINGE
PREFILLED_SYRINGE | INTRAVENOUS | Status: AC
  Filled 2020-09-12: qty 10

## 2020-09-12 MED ORDER — FENTANYL CITRATE (PF) 250 MCG/5ML IJ SOLN
INTRAMUSCULAR | Status: DC | PRN
  Administered 2020-09-12: 100 ug via INTRAVENOUS

## 2020-09-12 MED ORDER — EPHEDRINE SULFATE-NACL 50-0.9 MG/10ML-% IV SOSY
PREFILLED_SYRINGE | INTRAVENOUS | Status: DC | PRN
  Administered 2020-09-12: 5 mg via INTRAVENOUS

## 2020-09-12 MED ORDER — LIDOCAINE 2% (20 MG/ML) 5 ML SYRINGE
INTRAMUSCULAR | Status: DC | PRN
  Administered 2020-09-12: 20 mg via INTRAVENOUS

## 2020-09-12 MED ORDER — BUPIVACAINE-EPINEPHRINE (PF) 0.25% -1:200000 IJ SOLN
INTRAMUSCULAR | Status: AC
  Filled 2020-09-12: qty 30

## 2020-09-12 MED ORDER — HYDROMORPHONE HCL 1 MG/ML IJ SOLN: 0.2500 mg | INTRAMUSCULAR | Status: DC | PRN

## 2020-09-12 MED ORDER — MIDAZOLAM HCL 2 MG/2ML IJ SOLN: 0.5000 mg | Freq: Once | INTRAMUSCULAR | Status: DC | PRN

## 2020-09-12 MED ORDER — ONDANSETRON HCL 4 MG/2ML IJ SOLN
INTRAMUSCULAR | Status: DC | PRN
  Administered 2020-09-12: 4 mg via INTRAVENOUS

## 2020-09-12 MED ORDER — PHENYLEPHRINE 40 MCG/ML (10ML) SYRINGE FOR IV PUSH (FOR BLOOD PRESSURE SUPPORT)
PREFILLED_SYRINGE | INTRAVENOUS | Status: DC | PRN
  Administered 2020-09-12: 40 ug via INTRAVENOUS

## 2020-09-12 MED ORDER — CEFAZOLIN SODIUM-DEXTROSE 2-4 GM/100ML-% IV SOLN
2.0000 g | INTRAVENOUS | Status: AC
  Administered 2020-09-12: 2 g via INTRAVENOUS
  Filled 2020-09-12: qty 100

## 2020-09-12 MED ORDER — 0.9 % SODIUM CHLORIDE (POUR BTL) OPTIME
TOPICAL | Status: DC | PRN
  Administered 2020-09-12: 1000 mL

## 2020-09-12 MED ORDER — LACTATED RINGERS IV SOLN: INTRAVENOUS | Status: DC

## 2020-09-12 MED ORDER — MEPERIDINE HCL 50 MG/ML IJ SOLN: 6.2500 mg | INTRAMUSCULAR | Status: DC | PRN

## 2020-09-12 MED ORDER — SODIUM CHLORIDE 0.9 % IV SOLN
INTRAVENOUS | Status: DC | PRN
  Administered 2020-09-12: 10 mL

## 2020-09-12 MED ORDER — PROMETHAZINE HCL 25 MG/ML IJ SOLN: 6.2500 mg | INTRAMUSCULAR | Status: DC | PRN

## 2020-09-12 MED ORDER — PHENYLEPHRINE HCL (PRESSORS) 10 MG/ML IV SOLN
INTRAVENOUS | Status: AC
  Filled 2020-09-12: qty 1

## 2020-09-12 MED ORDER — BUPIVACAINE-EPINEPHRINE 0.5% -1:200000 IJ SOLN
INTRAMUSCULAR | Status: AC
  Filled 2020-09-12: qty 1

## 2020-09-12 MED ORDER — SUGAMMADEX SODIUM 200 MG/2ML IV SOLN
INTRAVENOUS | Status: DC | PRN
  Administered 2020-09-12: 200 mg via INTRAVENOUS

## 2020-09-12 MED ORDER — EPHEDRINE 5 MG/ML INJ
INTRAVENOUS | Status: AC
  Filled 2020-09-12: qty 10

## 2020-09-12 MED ORDER — PROPOFOL 10 MG/ML IV BOLUS
INTRAVENOUS | Status: DC | PRN
  Administered 2020-09-12: 70 mg via INTRAVENOUS

## 2020-09-12 MED ORDER — PROPOFOL 10 MG/ML IV BOLUS
INTRAVENOUS | Status: AC
  Filled 2020-09-12: qty 20

## 2020-09-12 MED ORDER — PHENYLEPHRINE HCL-NACL 10-0.9 MG/250ML-% IV SOLN
INTRAVENOUS | Status: DC | PRN
  Administered 2020-09-12: 50 ug/min via INTRAVENOUS

## 2020-09-12 MED ORDER — ONDANSETRON HCL 4 MG/2ML IJ SOLN
INTRAMUSCULAR | Status: AC
  Filled 2020-09-12: qty 2

## 2020-09-12 MED ORDER — OXYCODONE HCL 5 MG/5ML PO SOLN: 5.0000 mg | Freq: Once | ORAL | Status: DC | PRN

## 2020-09-12 MED ORDER — BUPIVACAINE-EPINEPHRINE 0.5% -1:200000 IJ SOLN
INTRAMUSCULAR | Status: DC | PRN
  Administered 2020-09-12: 30 mL

## 2020-09-12 MED ORDER — PHENYLEPHRINE 40 MCG/ML (10ML) SYRINGE FOR IV PUSH (FOR BLOOD PRESSURE SUPPORT)
PREFILLED_SYRINGE | INTRAVENOUS | Status: AC
  Filled 2020-09-12: qty 10

## 2020-09-12 MED ORDER — CHLORHEXIDINE GLUCONATE 0.12 % MT SOLN
15.0000 mL | Freq: Once | OROMUCOSAL | Status: AC
  Administered 2020-09-12: 15 mL via OROMUCOSAL

## 2020-09-12 MED ORDER — ESMOLOL HCL 100 MG/10ML IV SOLN
INTRAVENOUS | Status: AC
  Filled 2020-09-12: qty 10

## 2020-09-12 SURGICAL SUPPLY — 34 items
APPLIER CLIP ROT 10 11.4 M/L (STAPLE) ×2
CABLE HIGH FREQUENCY MONO STRZ (ELECTRODE) ×2 IMPLANT
CHLORAPREP W/TINT 26 (MISCELLANEOUS) ×4 IMPLANT
CLIP APPLIE ROT 10 11.4 M/L (STAPLE) ×1 IMPLANT
COVER MAYO STAND STRL (DRAPES) ×2 IMPLANT
COVER SURGICAL LIGHT HANDLE (MISCELLANEOUS) ×2 IMPLANT
COVER WAND RF STERILE (DRAPES) IMPLANT
DECANTER SPIKE VIAL GLASS SM (MISCELLANEOUS) ×2 IMPLANT
DERMABOND ADVANCED (GAUZE/BANDAGES/DRESSINGS) ×1
DERMABOND ADVANCED .7 DNX12 (GAUZE/BANDAGES/DRESSINGS) ×1 IMPLANT
DRAPE C-ARM 42X120 X-RAY (DRAPES) ×2 IMPLANT
ELECT REM PT RETURN 15FT ADLT (MISCELLANEOUS) ×2 IMPLANT
GAUZE SPONGE 2X2 8PLY STRL LF (GAUZE/BANDAGES/DRESSINGS) ×1 IMPLANT
GLOVE SURG ORTHO LTX SZ8 (GLOVE) ×2 IMPLANT
GOWN STRL REUS W/TWL XL LVL3 (GOWN DISPOSABLE) ×4 IMPLANT
HEMOSTAT SURGICEL 4X8 (HEMOSTASIS) IMPLANT
KIT BASIN OR (CUSTOM PROCEDURE TRAY) ×2 IMPLANT
KIT TURNOVER KIT A (KITS) ×2 IMPLANT
PENCIL SMOKE EVACUATOR (MISCELLANEOUS) IMPLANT
POUCH SPECIMEN RETRIEVAL 10MM (ENDOMECHANICALS) ×2 IMPLANT
SCISSORS LAP 5X35 DISP (ENDOMECHANICALS) ×2 IMPLANT
SET CHOLANGIOGRAPH MIX (MISCELLANEOUS) ×2 IMPLANT
SET IRRIG TUBING LAPAROSCOPIC (IRRIGATION / IRRIGATOR) ×2 IMPLANT
SET TUBE SMOKE EVAC HIGH FLOW (TUBING) IMPLANT
SLEEVE XCEL OPT CAN 5 100 (ENDOMECHANICALS) ×2 IMPLANT
SPONGE GAUZE 2X2 STER 10/PKG (GAUZE/BANDAGES/DRESSINGS) ×1
STRIP CLOSURE SKIN 1/2X4 (GAUZE/BANDAGES/DRESSINGS) IMPLANT
SUT MNCRL AB 4-0 PS2 18 (SUTURE) ×2 IMPLANT
TOWEL OR 17X26 10 PK STRL BLUE (TOWEL DISPOSABLE) ×2 IMPLANT
TOWEL OR NON WOVEN STRL DISP B (DISPOSABLE) ×2 IMPLANT
TRAY LAPAROSCOPIC (CUSTOM PROCEDURE TRAY) ×2 IMPLANT
TROCAR BLADELESS OPT 5 100 (ENDOMECHANICALS) ×2 IMPLANT
TROCAR XCEL BLUNT TIP 100MML (ENDOMECHANICALS) ×2 IMPLANT
TROCAR XCEL NON-BLD 11X100MML (ENDOMECHANICALS) ×2 IMPLANT

## 2020-09-12 NOTE — Transfer of Care (Signed)
Immediate Anesthesia Transfer of Care Note  Patient: Kimberly Mora  Procedure(s) Performed: LAPAROSCOPIC CHOLECYSTECTOMY WITH INTRAOPERATIVE CHOLANGIOGRAM (N/A ) EXCISION OF ANAL POLYP (N/A )  Patient Location: PACU  Anesthesia Type:General  Level of Consciousness: awake, alert , oriented and patient cooperative  Airway & Oxygen Therapy: Patient Spontanous Breathing and Patient connected to face mask oxygen  Post-op Assessment: Report given to RN and Post -op Vital signs reviewed and stable  Post vital signs: Reviewed and stable  Last Vitals:  Vitals Value Taken Time  BP 120/66 09/12/20 0930  Temp    Pulse 76 09/12/20 0931  Resp 25 09/12/20 0931  SpO2 95 % 09/12/20 0931  Vitals shown include unvalidated device data.  Last Pain:  Vitals:   09/12/20 0611  TempSrc:   PainSc: 0-No pain      Patients Stated Pain Goal: 4 (09/12/20 1610)  Complications: No complications documented.

## 2020-09-12 NOTE — Anesthesia Postprocedure Evaluation (Signed)
Anesthesia Post Note  Patient: Kimberly Mora  Procedure(s) Performed: LAPAROSCOPIC CHOLECYSTECTOMY WITH INTRAOPERATIVE CHOLANGIOGRAM (N/A ) EXCISION OF ANAL POLYP (N/A )     Patient location during evaluation: PACU Anesthesia Type: General Level of consciousness: awake and alert, patient cooperative and oriented Pain management: pain level controlled Vital Signs Assessment: post-procedure vital signs reviewed and stable Respiratory status: spontaneous breathing, nonlabored ventilation, respiratory function stable and patient connected to nasal cannula oxygen Cardiovascular status: blood pressure returned to baseline and stable Postop Assessment: no apparent nausea or vomiting Anesthetic complications: no   No complications documented.  Last Vitals:  Vitals:   09/12/20 1045 09/12/20 1100  BP: 115/63 (!) 120/57  Pulse: 62 63  Resp: 17 15  Temp:    SpO2: 92% 92%    Last Pain:  Vitals:   09/12/20 1045  TempSrc:   PainSc: 0-No pain                 Mylissa Lambe,E. Crysten Kaman

## 2020-09-12 NOTE — Discharge Instructions (Addendum)
Gen ane General Anesthesia, Adult, Care After This sheet gives you information about how to care for yourself after your procedure. Your health care provider may also give you more specific instructions. If you have problems or questions, contact your health care provider. What can I expect after the procedure? After the procedure, the following side effects are common:  Pain or discomfort at the IV site.  Nausea.  Vomiting.  Sore throat.  Trouble concentrating.  Feeling cold or chills.  Feeling weak or tired.  Sleepiness and fatigue.  Soreness and body aches. These side effects can affect parts of the body that were not involved in surgery. Follow these instructions at home: For the time period you were told by your health care provider:  Rest.  Do not participate in activities where you could fall or become injured.  Do not drive or use machinery.  Do not drink alcohol.  Do not take sleeping pills or medicines that cause drowsiness.  Do not make important decisions or sign legal documents.  Do not take care of children on your own.   Eating and drinking  Follow any instructions from your health care provider about eating or drinking restrictions.  When you feel hungry, start by eating small amounts of foods that are soft and easy to digest (bland), such as toast. Gradually return to your regular diet.  Drink enough fluid to keep your urine pale yellow.  If you vomit, rehydrate by drinking water, juice, or clear broth. General instructions  If you have sleep apnea, surgery and certain medicines can increase your risk for breathing problems. Follow instructions from your health care provider about wearing your sleep device: ? Anytime you are sleeping, including during daytime naps. ? While taking prescription pain medicines, sleeping medicines, or medicines that make you drowsy.  Have a responsible adult stay with you for the time you are told. It is important to  have someone help care for you until you are awake and alert.  Return to your normal activities as told by your health care provider. Ask your health care provider what activities are safe for you.  Take over-the-counter and prescription medicines only as told by your health care provider.  If you smoke, do not smoke without supervision.  Keep all follow-up visits as told by your health care provider. This is important. Contact a health care provider if:  You have nausea or vomiting that does not get better with medicine.  You cannot eat or drink without vomiting.  You have pain that does not get better with medicine.  You are unable to pass urine.  You develop a skin rash.  You have a fever.  You have redness around your IV site that gets worse. Get help right away if:  You have difficulty breathing.  You have chest pain.  You have blood in your urine or stool, or you vomit blood. Summary  After the procedure, it is common to have a sore throat or nausea. It is also common to feel tired.  Have a responsible adult stay with you for the time you are told. It is important to have someone help care for you until you are awake and alert.  When you feel hungry, start by eating small amounts of foods that are soft and easy to digest (bland), such as toast. Gradually return to your regular diet.  Drink enough fluid to keep your urine pale yellow.  Return to your normal activities as told by your health  care provider. Ask your health care provider what activities are safe for you. This information is not intended to replace advice given to you by your health care provider. Make sure you discuss any questions you have with your health care provider. Document Revised: 02/01/2020 Document Reviewed: 08/31/2019 Elsevier Patient Education  2021 Else[employer]. ANORECTAL SURGERY: POST OP INSTRUCTIONS 1. Take your usually prescribed home medications unless otherwise directed. 2. DIET:  During the first few hours after surgery sip on some liquids until you are able to urinate.  It is normal to not urinate for several hours after this surgery.  If you feel uncomfortable, please contact the office for instructions.  After you are able to urinate,you may eat, if you feel like it.  Follow a light bland diet the first 24 hours after arrival home, such as soup, liquids, crackers, etc.  Be sure to include lots of fluids daily (6-8 glasses).  Avoid fast food or heavy meals, as your are more likely to get nauseated.  Eat a low fat diet the next few days after surgery.  Limit caffeine intake to 1-2 servings a day. 3. PAIN CONTROL: a. Pain is best controlled by a usual combination of several different methods TOGETHER: i. Muscle relaxation: Soak in a warm bath (or Sitz bath) three times a day and after bowel movements.  Continue to do this until all pain is resolved. ii. Over the counter pain medication iii. Prescription pain medication b. Most patients will experience some swelling and discomfort in the anus/rectal area and incisions.  Heat such as warm towels, sitz baths, warm baths, etc to help relax tight/sore spots and speed recovery.  Some people prefer to use ice, especially in the first couple days after surgery, as it may decrease the pain and swelling, or alternate between ice & heat.  Experiment to what works for you.  Swelling and bruising can take several weeks to resolve.  Pain can take even longer to completely resolve. c. It is helpful to take an over-the-counter pain medication regularly for the first few weeks.  Choose one of the following that works best for you: i. Naproxen (Aleve, etc)  Two 220mg  tabs twice a day ii. Ibuprofen (Advil, etc) Three 200mg  tabs four times a day (every meal & bedtime) d. A  prescription for pain medication (such as percocet, oxycodone, hydrocodone, etc) should be given to you upon discharge.  Take your pain medication as prescribed.  i. If you are  having problems/concerns with the prescription medicine (does not control pain, nausea, vomiting, rash, itching, etc), please call [phone] to see if we need to switch you to a different pain medicine that will work better for you and/or control your side effect better. ii. If you need a refill on your pain medication, please contact your pharmacy.  They will contact our office to request authorization. Prescriptions will not be filled after 5 pm or on week-ends. 4. KEEP YOUR BOWELS REGULAR and AVOID CONSTIPATION a. The goal is one to two soft bowel movements a day.  You should at least have a bowel movement every other day. b. Avoid getting constipated.  Between the surgery and the pain medications, it is common to experience some constipation. This can be very painful after rectal surgery.  Increasing fluid intake and taking a fiber supplement (such as Metamucil, Citrucel, FiberCon, etc) 1-2 times a day regularly will usually help prevent this problem from occurring.  A stool softener like colace is also recommended.  This can be purchased over the counter at your pharmacy.  You can take it up to 3 times a day.  If you do not have a bowel movement after 24 hrs since your surgery, take one does of milk of magnesia.  If you still haven't had a bowel movement 8-12 hours after that dose, take another dose.  If you don't have a bowel movement 48 hrs after surgery, purchase a Fleets enema from the drug store and administer gently per package instructions.  If you still are having trouble with your bowel movements after that, please call the office for further instructions. c. If you develop diarrhea or have many loose bowel movements, simplify your diet to bland foods & liquids for a few days.  Stop any stool softeners and decrease your fiber supplement.  Switching to mild anti-diarrheal medications (Kayopectate, Pepto Bismol) can help.  If this worsens or does not improve, please call us.  5. Wound  Care a. Remove your bandages before your first bowel movement or 8 hours after surgery.     b. Remove any wound packing material at this tim,e as well.  You do not need to repack the wound unless instructed otherwise.  Wear an absorbent pad or soft cotton gauze in your underwear to catch any drainage and help keep the area clean. You should change this every 2-3 hours while awake. c. Keep the area clean and dry.  Bathe / shower every day, especially after bowel movements.  Keep the area clean by showering / bathing over the incision / wound.   It is okay to soak an open wound to help wash it.  Wet wipes or showers / gentle washing after bowel movements is often less traumatic than regular toilet paper. d. Bonita Quin may have some styrofoam-like soft packing in the rectum which will come out with the first bowel movement.  e. You will often notice bleeding with bowel movements.  This should slow down by the end of the first week of surgery f. Expect some drainage.  This should slow down, too, by the end of the first week of surgery.  Wear an absorbent pad or soft cotton gauze in your underwear until the drainage stops. g. Do Not sit on a rubber or pillow ring.  This can make you symptoms worse.  You may sit on a soft pillow if needed.  6. ACTIVITIES as tolerated:   a. You may resume regular (light) daily activities beginning the next day--such as daily self-care, walking, climbing stairs--gradually increasing activities as tolerated.  If you can walk 30 minutes without difficulty, it is safe to try more intense activity such as jogging, treadmill, bicycling, low-impact aerobics, swimming, etc. b. Save the most intensive and strenuous activity for last such as sit-ups, heavy lifting, contact sports, etc  Refrain from any heavy lifting or straining until you are off narcotics for pain control.   c. You may drive when you are no longer taking prescription pain medication, you can comfortably sit for long periods of  time, and you can safely maneuver your car and apply brakes. d. Bonita Quin may have sexual intercourse when it is comfortable.  7. FOLLOW UP in our office a. Please call [facility] at [phone] to set up an appointment to see your surgeon in the office for a follow-up appointment approximately 3-4 weeks after your surgery. b. Make sure that you call for this appointment the day you arrive home to insure a convenient appointment time. 10. IF  YOU HAVE DISABILITY OR FAMILY LEAVE FORMS, BRING THEM TO THE OFFICE FOR PROCESSING.  DO NOT GIVE THEM TO YOUR DOCTOR.     WHEN TO CALL us [phone]: 1. Poor pain control 2. Reactions / problems with new medications (rash/itching, nausea, etc)  3. Fever over 101.5 F (38.5 C) 4. Inability to urinate 5. Nausea and/or vomiting 6. Worsening swelling or bruising 7. Continued bleeding from incision. 8. Increased pain, redness, or drainage from the incision  The clinic staff is available to answer your questions during regular business hours (8:30am-5pm).  Please don't hesitate to call and ask to speak to one of our nurses for clinical concerns.   A surgeon from [facility] is always on call at the hospitals   If you have a medical emergency, go to the nearest emergency room or call 911.    [facility] [address], [address], [place], [place]  [address] ? MAIN: [phone] ? TOLL FREE: [phone] ? FAX [phone] [email or web address]

## 2020-09-12 NOTE — Op Note (Signed)
09/12/2020  9:14 AM  PATIENT:  Kimberly Mora  80 y.o. female  Patient Care Team: Patient, No Pcp Per (Inactive) as PCP - General (General Practice) Marykay Lex, MD as PCP - Cardiology (Cardiology)  PRE-OPERATIVE DIAGNOSIS:  chronic cholecystitis, bleeding anal polyp  POST-OPERATIVE DIAGNOSIS:  chronic cholecystitis, bleeding anal polyp  PROCEDURE:  LAPAROSCOPIC CHOLECYSTECTOMY WITH INTRAOPERATIVE CHOLANGIOGRAM EXCISION OF ANAL POLYP   Surgeon(s): Romie Levee, MD Darnell Level, MD  ASSISTANT: none   ANESTHESIA:   local and general  SPECIMEN:  Source of Specimen:  anal canal mass  DISPOSITION OF SPECIMEN:  PATHOLOGY  COUNTS:  YES  PLAN OF CARE: Discharge to home after PACU  PATIENT DISPOSITION:  PACU - hemodynamically stable.  INDICATION: 80 y.o. 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  General anesthesia was already induced without difficulty from the patient's prior procedure. The patient was then positioned in lithotomy position.  The patient was then prepped and draped in usual sterile fashion.  SCDs were noted to be in place prior to the initiation of anesthesia. A surgical timeout was performed indicating the correct patient, procedure, positioning and need for preoperative antibiotics.  A rectal block was performed using Marcaine with epinephrine mixed with Experel.    I began with a digital rectal exam.  There were no fixed or firm masses.  I then placed a Hill-Ferguson anoscope into the anal canal and evaluated this completely.  The polyp appeared to be arising from the right posterior rectal mucosa.  I elevated the polyp off of the sphincter complex using DeBakey forceps.  I then divided the polyp using electrocautery.  The mucosal edges were  reapproximated using a running 2-0 chromic suture.  Additional Marcaine was applied around the incision site.  A dressing was then applied over this.  The patient was awakened from anesthesia and sent to the postanesthesia care unit in stable condition.  All counts were correct per operating room staff.

## 2020-09-12 NOTE — Anesthesia Procedure Notes (Signed)
Procedure Name: Intubation Date/Time: 09/12/2020 7:54 AM Performed by: Mitzie Na, CRNA Pre-anesthesia Checklist: Patient identified, Emergency Drugs available, Suction available and Patient being monitored Patient Re-evaluated:Patient Re-evaluated prior to induction Oxygen Delivery Method: Circle system utilized Preoxygenation: Pre-oxygenation with 100% oxygen Induction Type: IV induction Ventilation: Mask ventilation without difficulty Laryngoscope Size: Mac and 3 Grade View: Grade I Tube type: Oral Tube size: 7.0 mm Number of attempts: 1 Airway Equipment and Method: Stylet and Oral airway Placement Confirmation: ETT inserted through vocal cords under direct vision,  positive ETCO2 and breath sounds checked- equal and bilateral Secured at: 23 cm Tube secured with: Tape Dental Injury: Teeth and Oropharynx as per pre-operative assessment

## 2020-09-12 NOTE — Interval H&P Note (Signed)
History and Physical Interval Note:  09/12/2020 6:58 AM  Kimberly Mora  has presented today for surgery, with the diagnosis of chronic cholecystitis.  The various methods of treatment have been discussed with the patient and family. After consideration of risks, benefits and other options for treatment, the patient has consented to    Procedure(s) with comments: LAPAROSCOPIC CHOLECYSTECTOMY WITH INTRAOPERATIVE CHOLANGIOGRAM (N/A) - 120 TOTAL TIME PLEASE EXCISION OF ANAL POLYP (N/A) as a surgical intervention.    The patient's history has been reviewed, patient examined, no change in status, stable for surgery.  I have reviewed the patient's chart and labs.  Questions were answered to the patient's satisfaction.    Darnell Level, MD [facility] Office: [phone]   Darnell Level

## 2020-09-12 NOTE — Interval H&P Note (Signed)
History and Physical Interval Note:  09/12/2020 7:21 AM  Kimberly Mora  has presented today for surgery, with the diagnosis of chronic cholecystitis.  The various methods of treatment have been discussed with the patient and family. After consideration of risks, benefits and other options for treatment, the patient has consented to  Procedure(s) with comments: LAPAROSCOPIC CHOLECYSTECTOMY WITH INTRAOPERATIVE CHOLANGIOGRAM (N/A) - 120 TOTAL TIME PLEASE EXCISION OF ANAL POLYP (N/A) as a surgical intervention.  The patient's history has been reviewed, patient examined, no change in status, stable for surgery.  I have reviewed the patient's chart and labs.  Questions were answered to the patient's satisfaction.     Vanita Panda, MD  Colorectal and General Surgery [facility]

## 2020-09-12 NOTE — Op Note (Signed)
Procedure Note  Pre-operative Diagnosis:  Chronic cholecystitis, cholelithiasis, RUQ abdominal pain  Post-operative Diagnosis:  same  Surgeon:  Darnell Level, MD  Assistant:  Romie Levee, MD   Procedure:  Laparoscopic cholecystectomy with intra-operative cholangiography  Anesthesia:  General  Estimated Blood Loss:  minimal  Drains: none         Specimen: gallbladder to pathology  Indications:  Patient is referred by Dr. Rochele Raring in the emergency department for evaluation of right upper quadrant abdominal pain, gallstones, and rectal bleeding. Patient was seen in the emergency department on December 17. She presented with a history of intermittent right upper quadrant abdominal pain which had become more persistent. Patient underwent CT scan of the abdomen which demonstrated cholelithiasis. There were no other acute findings. Laboratory studies showed a normal CBC and metabolic profile. Lipase was normal. Patient also was complaining of rectal bleeding with each bowel movement. Patient has a history of coronary artery disease and has a distant history of stent placement. She does take Plavix. Patient has relocated from [place] to [place] and now to [place] and is accompanied today by her daughter. She does not have a primary care physician or a cardiologist here in [place]. Previous abdominal surgery includes a percutaneous gastrostomy tube after her stroke. 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 She denies any other abdominal surgery. She denies any history of hepatobiliary or pancreatic disease. She denies jaundice. She denies any history of hepatitis or pancreatitis. Patient now comes to surgery for cholecystectomy.  Procedure Details:  The patient was seen in the pre-op holding area. The risks, benefits, complications, treatment options, and expected outcomes were previously discussed with the patient. The patient agreed  with the proposed plan and has signed the informed consent form.  The patient was transported to operating room #5 at the [facility]. The patient was placed in the supine position on the operating room table. Following induction of general anesthesia, the abdomen was prepped and draped in the usual aseptic fashion.  An incision was made in the skin near the umbilicus. The midline fascia was incised and the peritoneal cavity was entered and a Hasson cannula was introduced under direct vision. The cannula was secured with a 0-Vicryl pursestring suture. Pneumoperitoneum was established with carbon dioxide. Additional cannulae were introduced under direct vision along the right costal margin in the midline, mid-clavicular line, and anterior axillary line.   The gallbladder was identified and the fundus grasped and retracted cephalad. Adhesions were taken down bluntly and the electrocautery was utilized as needed, taking care not to involve any adjacent structures. The infundibulum was grasped and retracted laterally, exposing the peritoneum overlying the triangle of Calot. The peritoneum was incised and structures exposed with blunt dissection. The cystic duct was clearly identified, bluntly dissected circumferentially, and clipped at the neck of the gallbladder.  An incision was made in the cystic duct and the cholangiogram catheter introduced. The catheter was secured using an ligaclip.  Real-time cholangiography was performed using C-arm fluoroscopy.  There was rapid filling of a normal caliber common bile duct.  There was reflux of contrast into the left and right hepatic ductal systems.  There was free flow distally into the duodenum without filling defect or obstruction.  The catheter was removed from the peritoneal cavity.  The cystic duct was then ligated with ligaclips and divided. The cystic artery was identified, dissected circumferentially, ligated with ligaclips, and divided.  The  gallbladder was dissected away from  the gallbladder bed using the electrocautery for hemostasis. The gallbladder was completely removed from the liver and placed into an endocatch bag. The gallbladder was removed in the endocatch bag through the umbilical port site and submitted to pathology for review.  The right upper quadrant was irrigated and the gallbladder bed was inspected. Hemostasis was achieved with the electrocautery.  Cannulae were removed under direct vision and good hemostasis was noted. Pneumoperitoneum was released and the majority of the carbon dioxide evacuated. The umbilical wound was irrigated and the fascia was then closed with the pursestring suture.  Local anesthetic was infiltrated at all port sites. Skin incisions were closed with 4-0 Monocril subcuticular sutures and Dermabond was applied.  Instrument, sponge, and needle counts were correct at the conclusion of the case.  The patient was awakened from anesthesia and brought to the recovery room in stable condition.  The patient tolerated the procedure well.   Darnell Level, MD [facility] Office: [phone]

## 2020-09-13 ENCOUNTER — Encounter (HOSPITAL_COMMUNITY): Payer: Self-pay | Admitting: Surgery

## 2020-09-13 LAB — SURGICAL PATHOLOGY

## 2020-11-28 ENCOUNTER — Other Ambulatory Visit: Payer: Self-pay | Admitting: *Deleted

## 2020-11-28 DIAGNOSIS — I712 Thoracic aortic aneurysm, without rupture, unspecified: Secondary | ICD-10-CM

## 2020-12-25 ENCOUNTER — Other Ambulatory Visit: Payer: Self-pay | Admitting: Cardiology

## 2020-12-31 ENCOUNTER — Encounter: Payer: Medicare Other | Admitting: Thoracic Surgery (Cardiothoracic Vascular Surgery)

## 2021-01-02 ENCOUNTER — Inpatient Hospital Stay: Admission: RE | Admit: 2021-01-02 | Payer: Medicare Other | Source: Ambulatory Visit

## 2021-02-20 ENCOUNTER — Other Ambulatory Visit: Payer: Self-pay

## 2021-02-20 MED ORDER — LOSARTAN POTASSIUM 25 MG PO TABS: 25.0000 mg | ORAL_TABLET | Freq: Every day | ORAL | 1 refills | Status: AC

## 2021-07-17 IMAGING — CT CT ABD-PELV W/ CM
2 of 5 series · 16 of 46 positions shown, 18 images · IV contrast (Omni 300)
Comparison: March 18, 2018

CLINICAL DATA: Right lower quadrant pain.

EXAM:
CT ABDOMEN AND PELVIS WITH CONTRAST
TECHNIQUE: Multidetector CT imaging of the abdomen and pelvis was performed
using the standard protocol following bolus administration of
intravenous contrast.
CONTRAST:  100mL OMNIPAQUE IOHEXOL 300 MG/ML  SOLN

[Series 3: a/p w/ 5mm · axial · 0.83mm/px · z∈[-407,-7]mm · 13 of 90 slices shown, 15 images]
[im 5/90  soft-tissue]
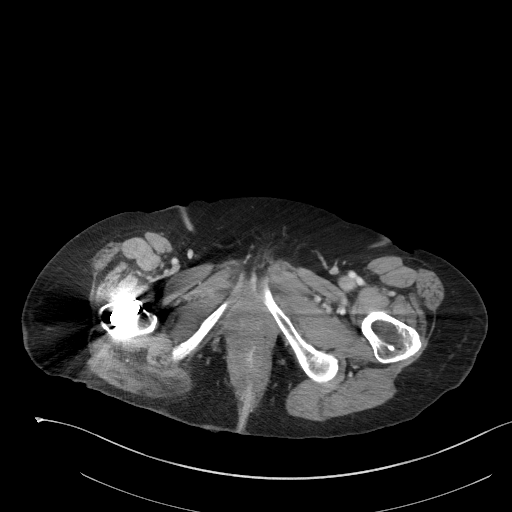
[im 5/90  bone]
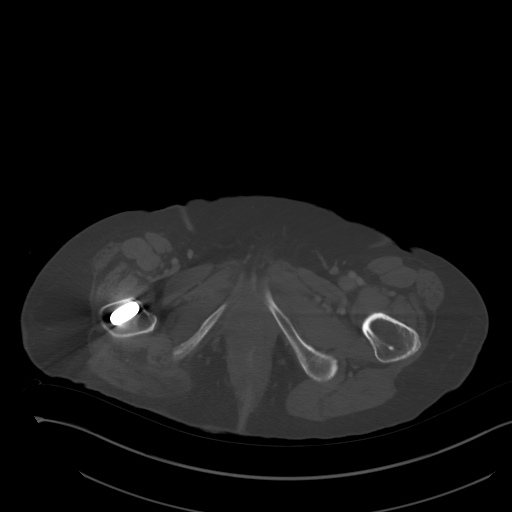
[im 10/90  soft-tissue]
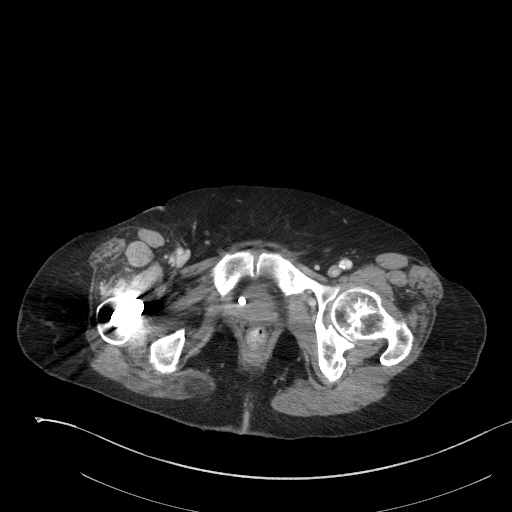
[im 20/90  soft-tissue]
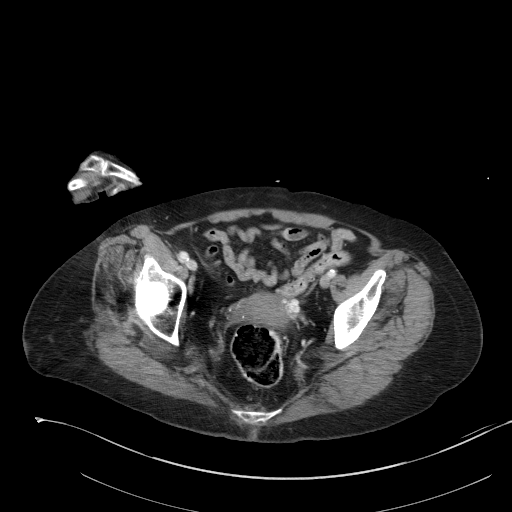
[im 25/90  soft-tissue]
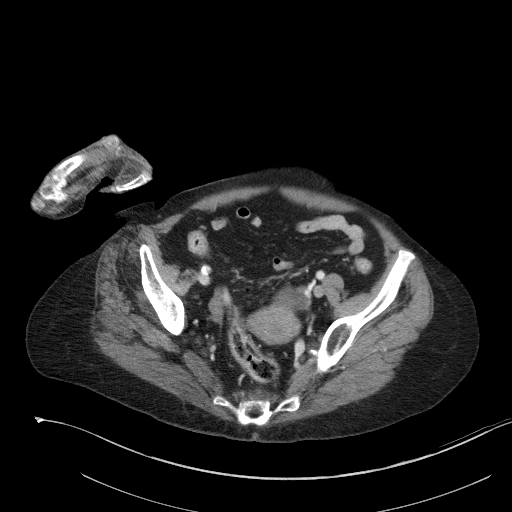
[im 30/90  soft-tissue]
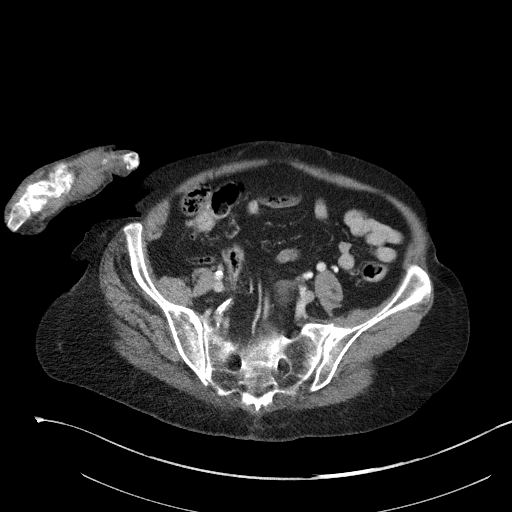
[im 40/90  soft-tissue]
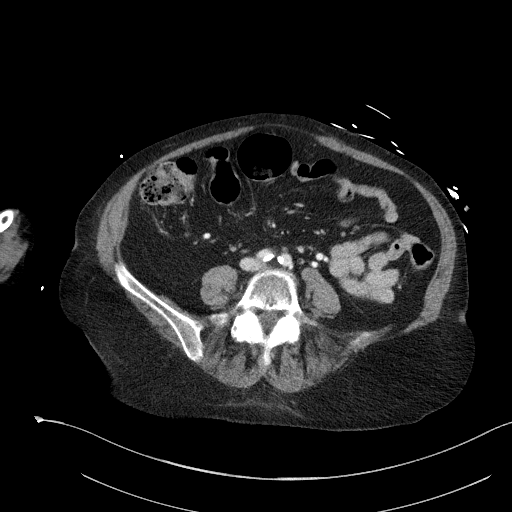
[im 45/90  soft-tissue]
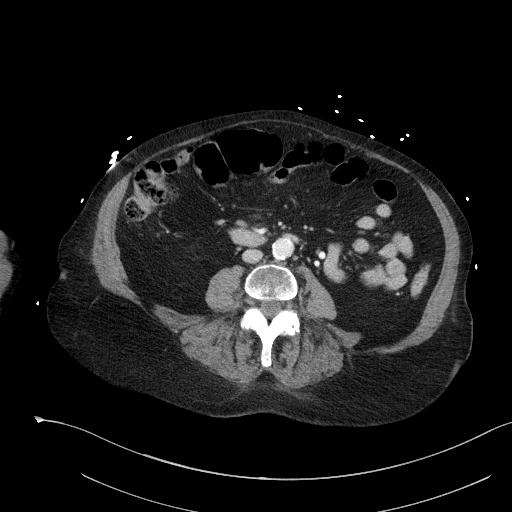
[im 50/90  soft-tissue]
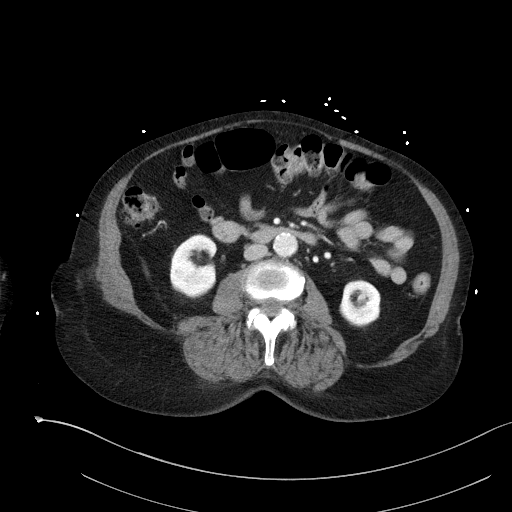
[im 60/90  soft-tissue]
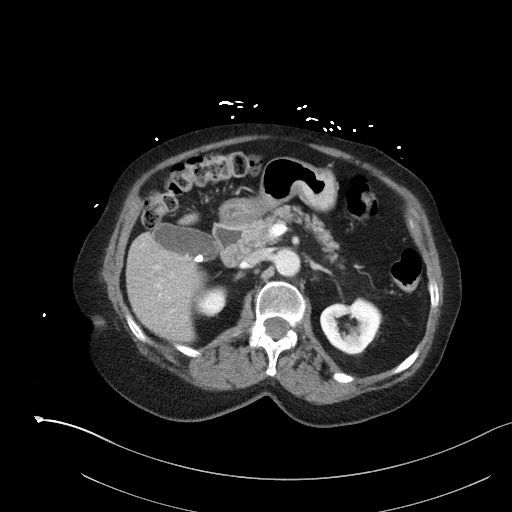
[im 60/90  bone]
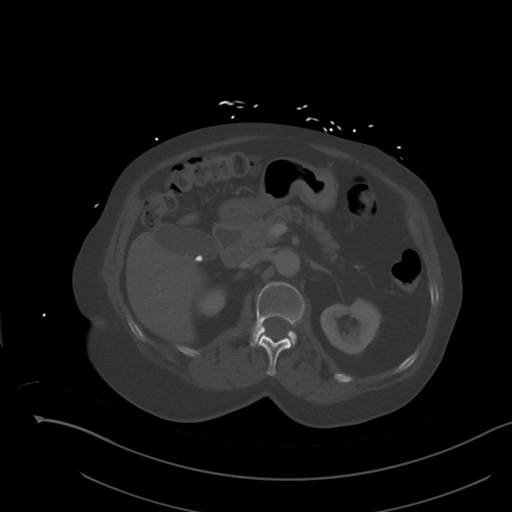
[im 65/90  soft-tissue]
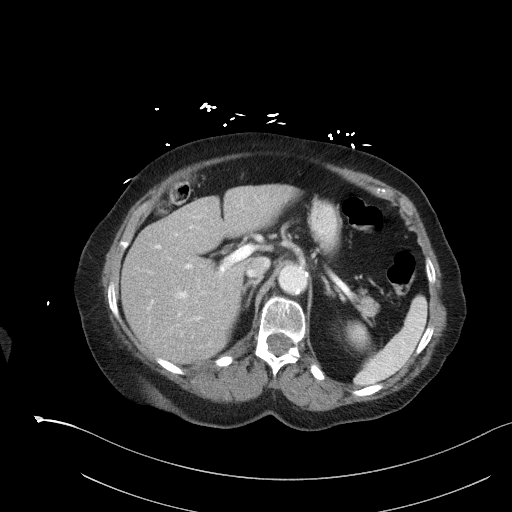
[im 70/90  soft-tissue]
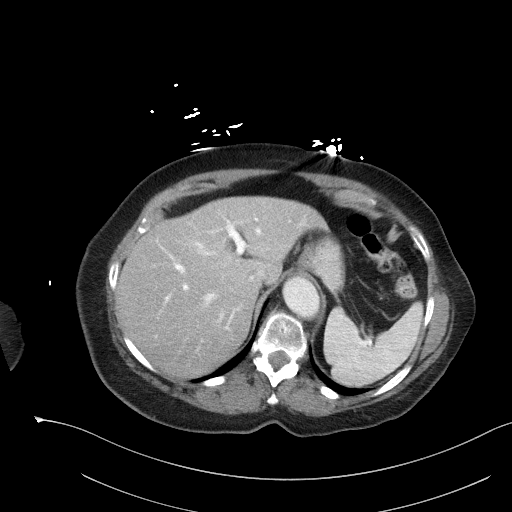
[im 80/90  soft-tissue]
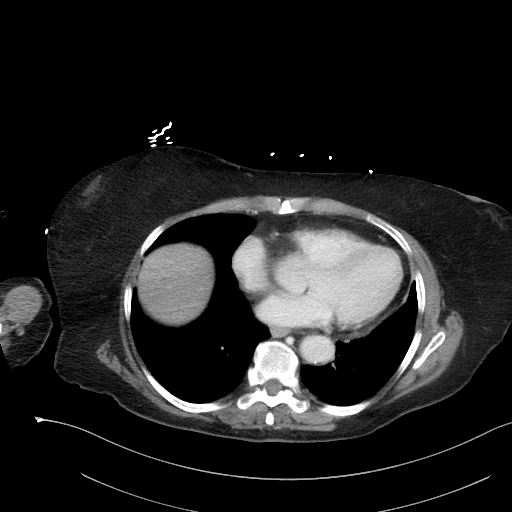
[im 85/90  soft-tissue]
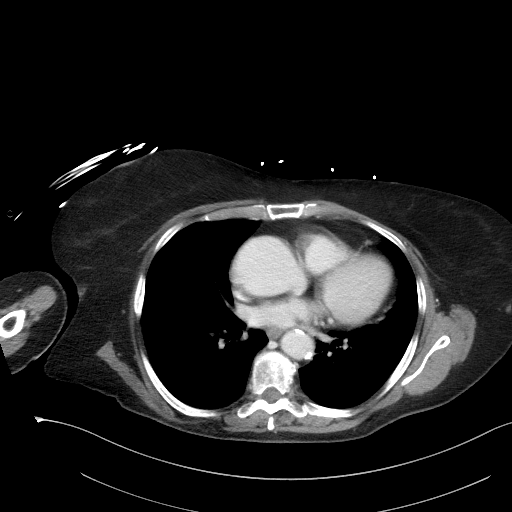

[Series 6: a/p w/ cor · coronal · 0.74mm/px · 3 of 145 slices shown]
[im 49/145  soft-tissue]
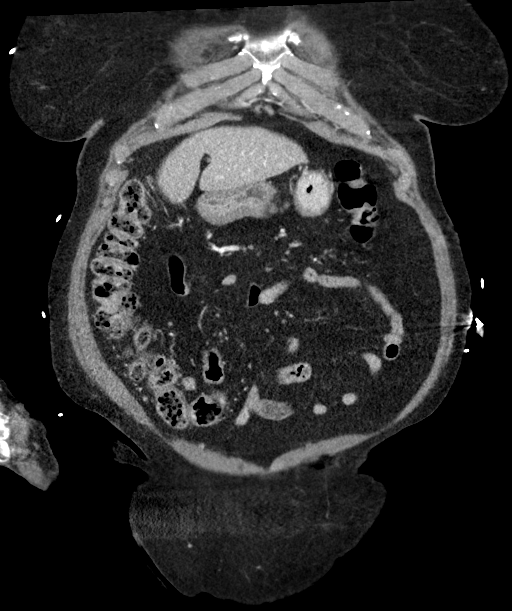
[im 65/145  soft-tissue]
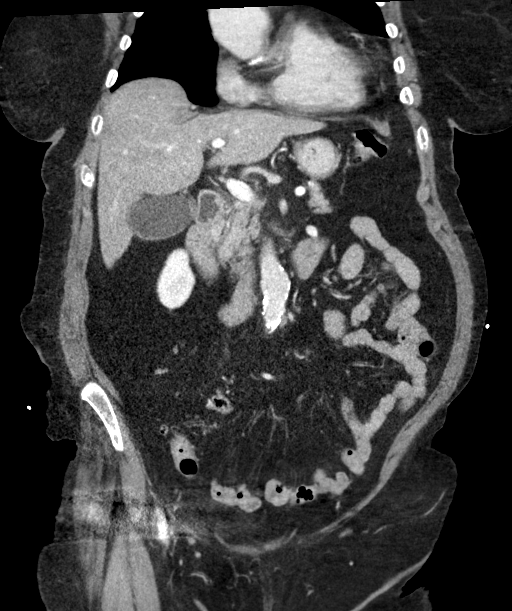
[im 81/145  soft-tissue]
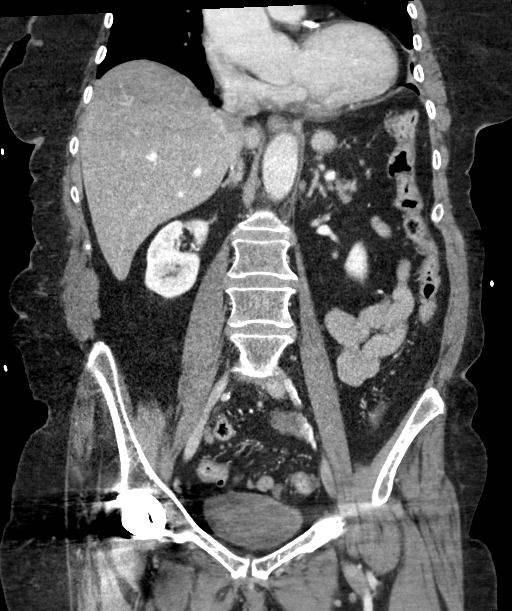

[16 of 46 positions shown; findings below may reference images not displayed]

FINDINGS: Lower chest: The visualized portion of the ascending thoracic aorta
measures 5.5 cm x 5.0 cm.

Hepatobiliary: There is diffuse fatty infiltration of the liver
parenchyma. No focal liver abnormality is seen. Multiple
subcentimeter gallstones are seen within the lumen of an otherwise
normal-appearing gallbladder.

Pancreas: Unremarkable. No pancreatic ductal dilatation or
surrounding inflammatory changes.

Spleen: Normal in size without focal abnormality.

Adrenals/Urinary Tract: Adrenal glands are unremarkable. Kidneys are
normal in size, without renal calculi or hydronephrosis. Stable
subcentimeter cystic appearing areas are seen within the lower pole
of the right kidney. Bladder is unremarkable.

Stomach/Bowel: Stomach is within normal limits. Appendix appears
normal. No evidence of bowel wall thickening, distention, or
inflammatory changes. Noninflamed diverticula are seen throughout
the sigmoid colon.

Vascular/Lymphatic: Aortic atherosclerosis. No enlarged abdominal or
pelvic lymph nodes.

Reproductive: The uterus is normal in size and appearance. A 3.7 cm
x 2.8 cm cyst is seen within the left adnexa. This is present on the
prior study.

Other: No abdominal wall hernia or abnormality. No abdominopelvic
ascites.

Musculoskeletal: A total right hip replacement is seen with
associated streak artifact and subsequently limited evaluation of
the adjacent osseous and soft tissue structures.

Degenerative changes seen within the lumbar spine.
IMPRESSION: 1. Cholelithiasis.
2. Fatty liver.
3. Sigmoid diverticulosis.
4. Left adnexal cyst, likely ovarian in origin. Correlation with
pelvic ultrasound is recommended. This
recommendation follows ACR consensus guidelines: White Paper of the
ACR Incidental Findings Committee II on Adnexal Findings. [HOSPITAL] [DATE].
5. Aortic atherosclerosis.

Aortic Atherosclerosis (CS1LT-XSV.V).

## 2021-08-12 IMAGING — US US ABDOMEN COMPLETE
1 series · 14 of 25 positions shown · non-contrast
Comparison: 05/18/2020 and prior.

CLINICAL DATA: RUQ pain

EXAM:
ABDOMEN ULTRASOUND COMPLETE

[Series 1: us abdomen complete · 0.20mm/px · 14 of 85 slices shown]
[im 1/85]
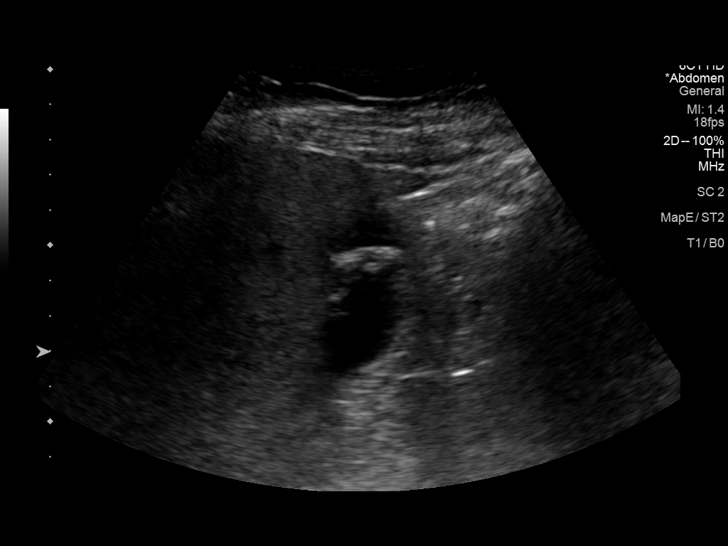
[im 8/85]
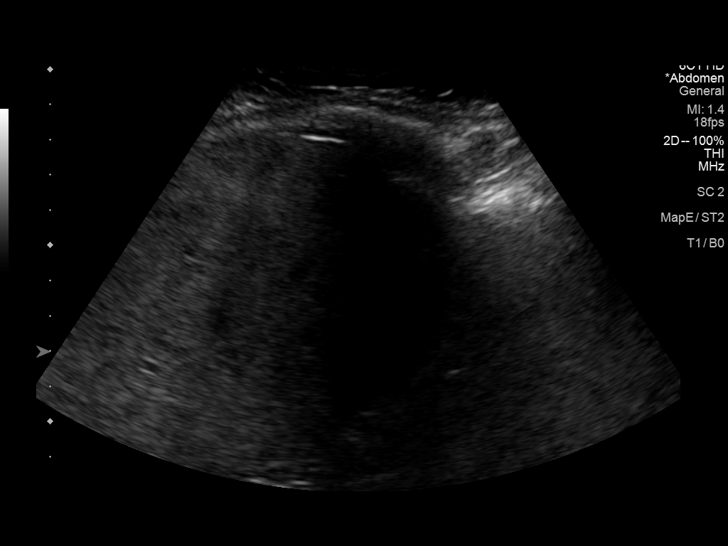
[im 15/85]
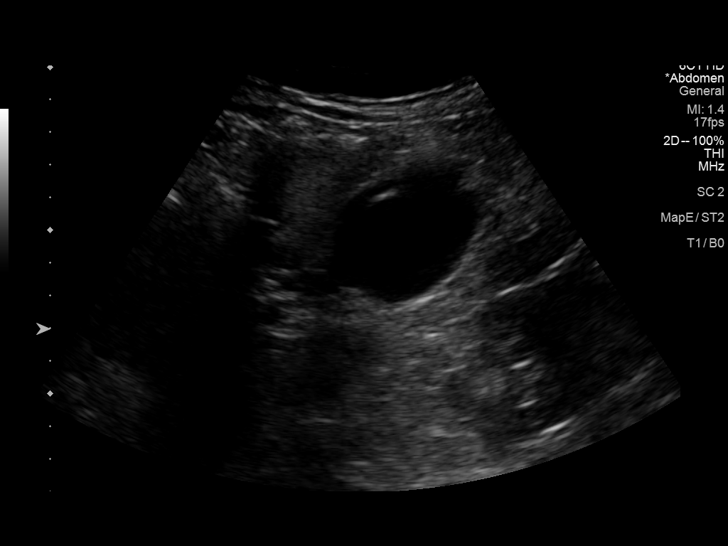
[im 22/85]
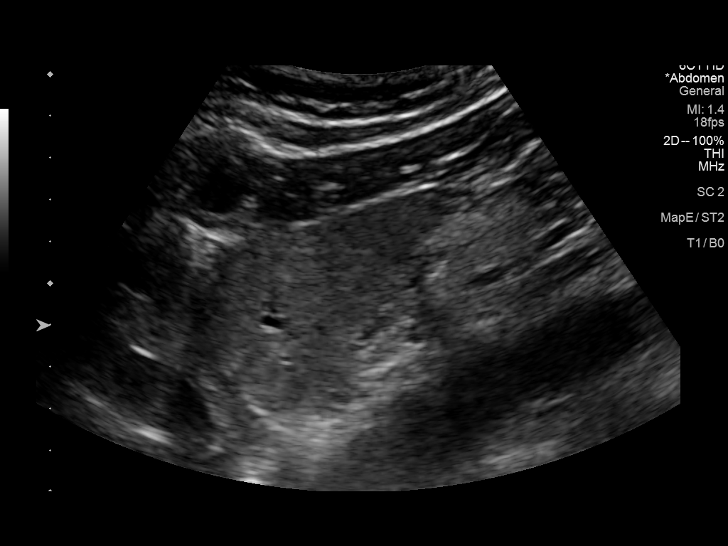
[im 29/85]
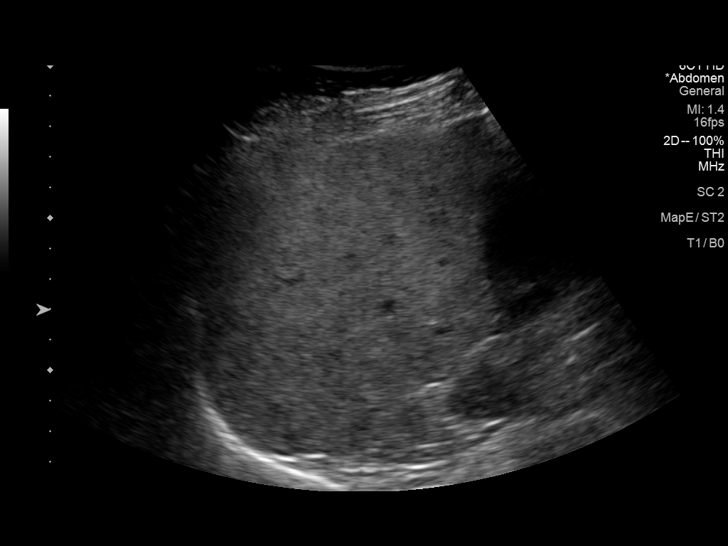
[im 32/85]
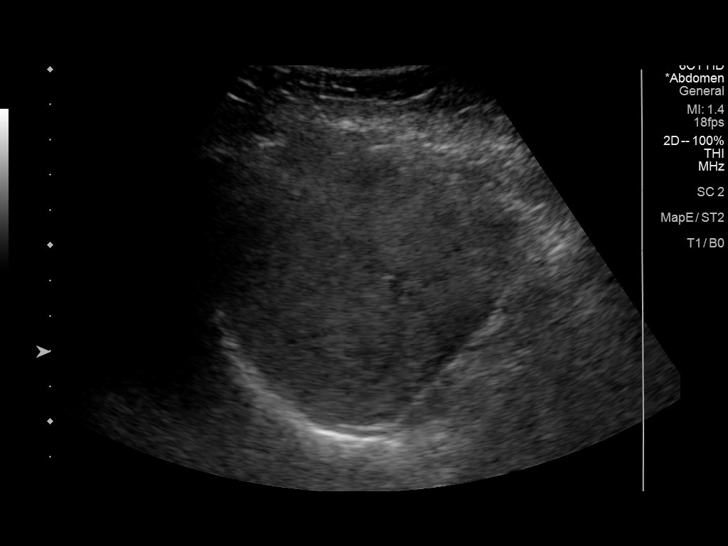
[im 39/85]
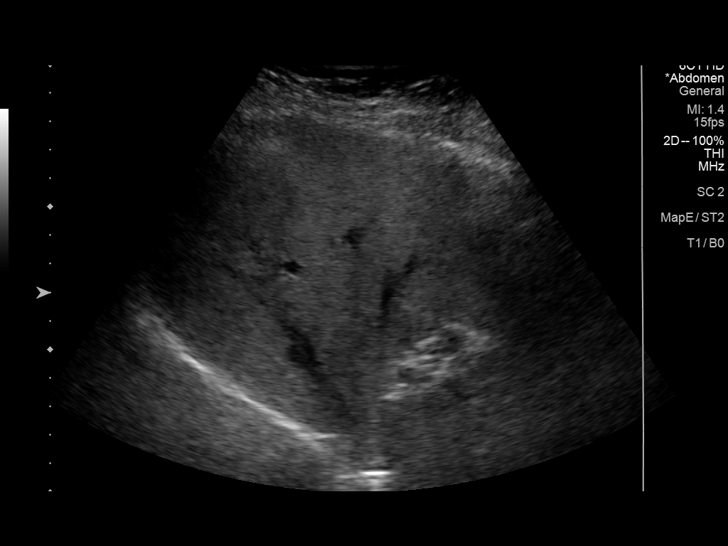
[im 46/85]
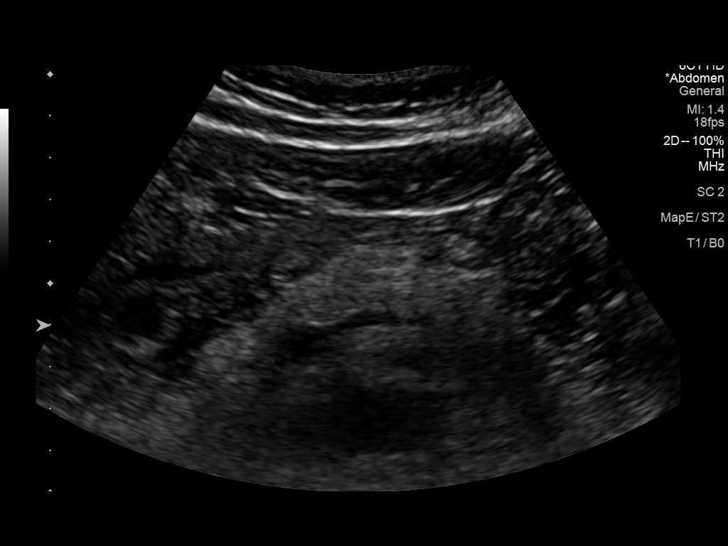
[im 53/85]
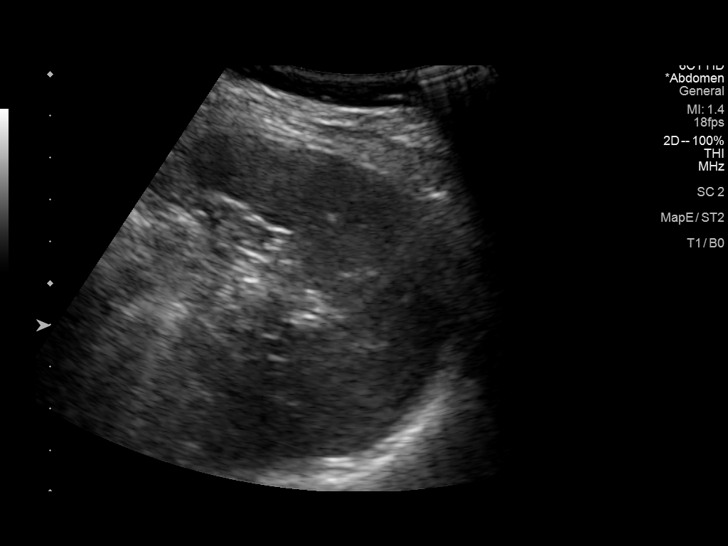
[im 57/85]
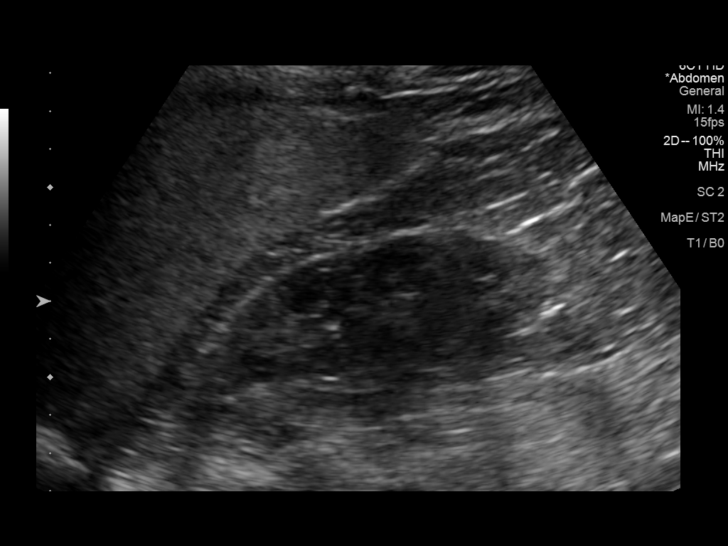
[im 64/85]
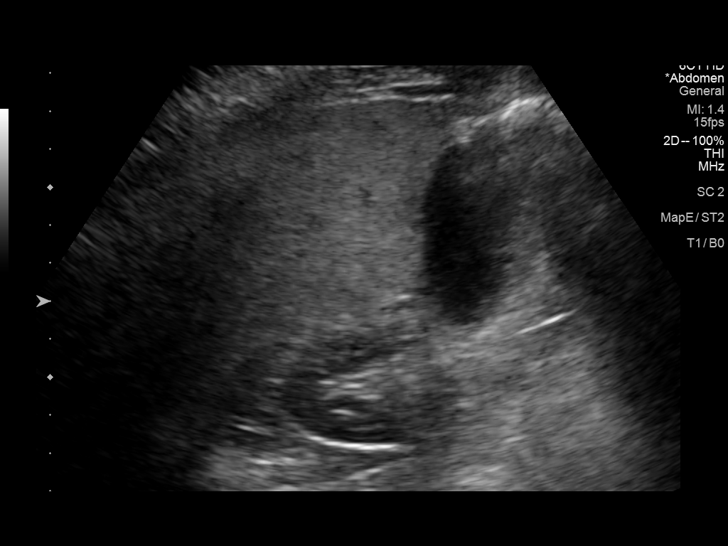
[im 71/85]
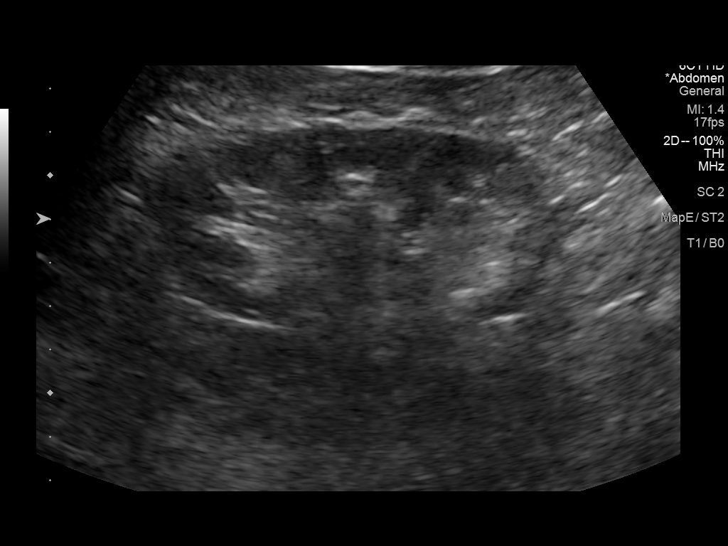
[im 78/85]
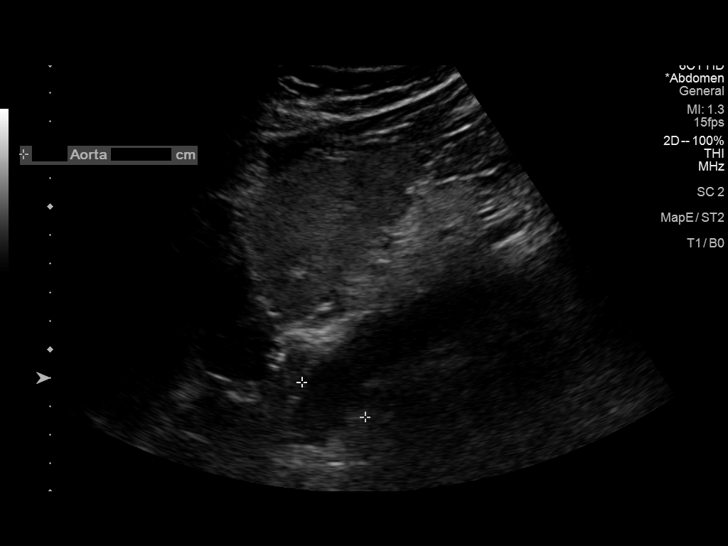
[im 85/85]
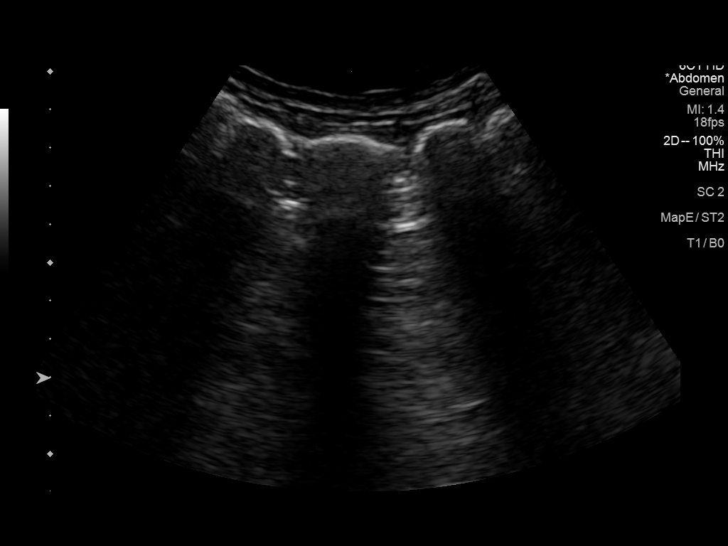

[14 of 25 positions shown; findings below may reference images not displayed]

FINDINGS: Gallbladder: No wall thickening visualized. Intraluminal calculi
measuring up to 1.2 cm. No sonographic Murphy sign noted by
sonographer.

Common bile duct: Diameter: 3.8 mm

Liver: No focal lesion identified. Increased parenchymal
echogenicity. Portal vein is patent on color Doppler imaging with
normal direction of blood flow towards the liver.

IVC: No abnormality visualized.

Pancreas: Visualized portion unremarkable.

Spleen: Size and appearance within normal limits.

Right Kidney: Length: 9.1 cm. Echogenicity within normal limits. No
mass or hydronephrosis visualized.

Left Kidney: Length: 9.0 cm. Echogenicity within normal limits. No
mass or hydronephrosis visualized.

Abdominal aorta: No aneurysm visualized.

Other findings: None.
IMPRESSION: Hepatic steatosis.  No focal hepatic lesion.

Cholelithiasis without cholecystitis.
